# Patient Record
Sex: Male | Born: 1985 | Race: White | Hispanic: No | Marital: Married | Smoking: Former smoker
Health system: Southern US, Community
[De-identification: ages and names within clinical notes are randomized; demographics above are authoritative.]

## PROBLEM LIST (undated history)

## (undated) DIAGNOSIS — R7303 Prediabetes: Secondary | ICD-10-CM

## (undated) DIAGNOSIS — I10 Essential (primary) hypertension: Secondary | ICD-10-CM

## (undated) DIAGNOSIS — F419 Anxiety disorder, unspecified: Secondary | ICD-10-CM

## (undated) DIAGNOSIS — E785 Hyperlipidemia, unspecified: Secondary | ICD-10-CM

## (undated) DIAGNOSIS — F909 Attention-deficit hyperactivity disorder, unspecified type: Secondary | ICD-10-CM

## (undated) DIAGNOSIS — T7840XA Allergy, unspecified, initial encounter: Secondary | ICD-10-CM

## (undated) DIAGNOSIS — E7800 Pure hypercholesterolemia, unspecified: Secondary | ICD-10-CM

## (undated) DIAGNOSIS — I319 Disease of pericardium, unspecified: Secondary | ICD-10-CM

## (undated) DIAGNOSIS — R000 Tachycardia, unspecified: Secondary | ICD-10-CM

## (undated) HISTORY — DX: Allergy, unspecified, initial encounter: T78.40XA

## (undated) HISTORY — DX: Hyperlipidemia, unspecified: E78.5

## (undated) HISTORY — PX: TONSILLECTOMY: SUR1361

## (undated) HISTORY — DX: Prediabetes: R73.03

## (undated) HISTORY — DX: Anxiety disorder, unspecified: F41.9

## (undated) HISTORY — PX: OTHER SURGICAL HISTORY: SHX169

## (undated) HISTORY — DX: Essential (primary) hypertension: I10

---
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
## 2006-03-20 ENCOUNTER — Inpatient Hospital Stay (HOSPITAL_COMMUNITY): Admission: EM | Admit: 2006-03-20 | Discharge: 2006-03-21 | Payer: Self-pay | Admitting: Emergency Medicine

## 2006-03-20 ENCOUNTER — Ambulatory Visit: Payer: Self-pay | Admitting: Cardiology

## 2006-03-21 ENCOUNTER — Encounter: Payer: Self-pay | Admitting: Cardiology

## 2006-03-29 ENCOUNTER — Ambulatory Visit: Payer: Self-pay | Admitting: Cardiology

## 2006-03-29 ENCOUNTER — Ambulatory Visit: Payer: Self-pay

## 2006-03-29 ENCOUNTER — Encounter: Payer: Self-pay | Admitting: Cardiology

## 2006-04-05 ENCOUNTER — Ambulatory Visit: Payer: Self-pay | Admitting: Cardiology

## 2006-04-05 LAB — CONVERTED CEMR LAB
BUN: 12 mg/dL (ref 6–23)
CO2: 31 meq/L (ref 19–32)
Chloride: 102 meq/L (ref 96–112)
GFR calc non Af Amer: 101 mL/min
Sodium: 140 meq/L (ref 135–145)

## 2006-04-14 ENCOUNTER — Ambulatory Visit: Payer: Self-pay | Admitting: Cardiology

## 2006-05-03 ENCOUNTER — Ambulatory Visit: Payer: Self-pay | Admitting: Cardiology

## 2007-05-05 ENCOUNTER — Ambulatory Visit (HOSPITAL_COMMUNITY): Admission: RE | Admit: 2007-05-05 | Discharge: 2007-05-05 | Payer: Self-pay | Admitting: Family Medicine

## 2007-05-07 ENCOUNTER — Ambulatory Visit (HOSPITAL_COMMUNITY): Admission: RE | Admit: 2007-05-07 | Discharge: 2007-05-07 | Payer: Self-pay | Admitting: Family Medicine

## 2007-05-19 ENCOUNTER — Encounter (INDEPENDENT_AMBULATORY_CARE_PROVIDER_SITE_OTHER): Payer: Self-pay | Admitting: Family Medicine

## 2007-05-19 ENCOUNTER — Ambulatory Visit (HOSPITAL_COMMUNITY): Admission: RE | Admit: 2007-05-19 | Discharge: 2007-05-19 | Payer: Self-pay | Admitting: Family Medicine

## 2007-05-19 ENCOUNTER — Ambulatory Visit: Payer: Self-pay | Admitting: Vascular Surgery

## 2007-11-30 ENCOUNTER — Emergency Department (HOSPITAL_COMMUNITY): Admission: EM | Admit: 2007-11-30 | Discharge: 2007-12-01 | Payer: Self-pay | Admitting: Emergency Medicine

## 2008-08-09 ENCOUNTER — Encounter (INDEPENDENT_AMBULATORY_CARE_PROVIDER_SITE_OTHER): Payer: Self-pay | Admitting: *Deleted

## 2009-03-28 ENCOUNTER — Emergency Department (HOSPITAL_COMMUNITY): Admission: EM | Admit: 2009-03-28 | Discharge: 2009-03-28 | Payer: Self-pay | Admitting: Family Medicine

## 2009-04-26 ENCOUNTER — Emergency Department (HOSPITAL_COMMUNITY): Admission: EM | Admit: 2009-04-26 | Discharge: 2009-04-26 | Payer: Self-pay | Admitting: Family Medicine

## 2009-05-03 ENCOUNTER — Emergency Department (HOSPITAL_COMMUNITY): Admission: EM | Admit: 2009-05-03 | Discharge: 2009-05-03 | Payer: Self-pay | Admitting: Emergency Medicine

## 2010-06-26 NOTE — Assessment & Plan Note (Signed)
[facility] HEALTHCARE                            CARDIOLOGY OFFICE NOTE   Glenn Burton, Glenn Burton                     MRN:          [id number]  DATE:04/05/2006                            DOB:          06-Jul-1985    Glenn Burton is a 25 year old male that was recently admitted to [facility] on March 20, 2006 secondary to chest pain.  His pain  was in the substernal and left chest area, and increased with lying flat  and deep inspiration.  It improved with sitting up.  He was diagnosed  with pericarditis, and placed on Motrin.  Note, he did have a fever when  he was in the hospital.  There was no preceding URI.  His TSH was  normal.  His C reactive protein was 0.4.  A drug screen was negative.  He also had an echocardiogram at that time that showed normal LV  function.  A chest x-ray showed no acute abnormalities.  He returned in  followup on March 29, 2006.  Note, at that time he was placed back on  Motrin, as a pharmacist told him that aspirin was better, and he had not  taken Motrin.  He also had an HIV checked that was normal.  A PTD was  placed.  He did not return to have this checked, but he states that  there was no reaction.  A followup echocardiogram on February 19 showed  normal LV function, and there was no effusion.  He continues to have  chest pain.  He states his pain does worsen with lying flat and with  deep inspiration, and improves with sitting up.  It has been persistent  for several weeks.   His medications include Motrin 600 mg p.o. t.i.d. with food.   PHYSICAL EXAMINATION:  Shows blood pressure of 155/91.  His pulse is  100.  NECK:  Supple.  CHEST:  Clear.  CARDIOVASCULAR:  Regular rate and rhythm.  I could not appreciate a rub.  EXTREMITIES:  Showed no edema.   DIAGNOSES:  1. Pericarditis.  2. Mild hypertension in the setting of his pericarditis.   PLAN:  Mr. Durnil continues to have chest pain.  I have asked him  to  discontinue his Motrin, and we will begin Indocin 50 mg p.o. b.i.d. for  symptomatic relief.  I have asked him to take this with food.  We will  check a BMET today to follow his potassium and renal function.  We will  also observe for any type of GI distress related to his nonsteroidal  use.  I will also add colchicine 0.6 mg p.o. b.i.d. for 3 days, and then  decrease to 0.6 mg p.o. daily.  I will have him return in approximately  4 weeks.  If his pain persists, then we may need to try a course of  steroids.  His workup to date has been negative.  I think this is most  likely a viral pericarditis, as he did have a fever when he was in the  hospital.     Madolyn Frieze. Jens Som, MD, [facility]  Electronically Signed    BSC/MedQ  DD: 04/05/2006  DT: 04/05/2006  Job #: [id number]

## 2010-06-26 NOTE — H&P (Signed)
NAMEVERBON, GIANGREGORIO NO.:  [id number]   MEDICAL RECORD NO.:  [id number]          PATIENT TYPE:  INP   LOCATION:  1824                         FACILITY:  [facility]   PHYSICIAN:  Lorain Childes, MD DATE OF BIRTH:  1985/03/08   DATE OF ADMISSION:  03/20/2006  DATE OF DISCHARGE:                              HISTORY & PHYSICAL   PRIMARY CARE PHYSICIAN:  Dr. Micah Noel.   CHIEF COMPLAINT:  Chest pain.   HISTORY OF PRESENT ILLNESS:  The patient is a 25 year old gentleman who  works here who began having chest pain this evening or this early  morning around 2:30 a.m.  He is brought to the emergency room for  further evaluation and management.  He reports his pain was sharp and  stabbing, severe, and associated with intense nausea and some shortness  of breath and also lightheadedness.  This lasted approximately 30  seconds and then resolved, but then he continued to have some nausea and  a dull ache in his chest.  He reports that the pain was occurring while  he was walking around and improves when he is recumbent.  He also  develops a pain when he sits up on the stretcher and leaning forward.  He says he feels best without any discomfort when he is lying back.  He  still has minimal dull pain while he is sitting up.  He denies any  heaviness or tightness to his pain.  He has not had pain like this  previously, but has had some chest pain for which he was seen by his  primary care physician and told that he was okay.  He denies a recent  viral illness.  No nausea or vomiting.  No diarrhea.  No coughing or  sputum production.  He also states that he grew up playing sports, but  he did have some difficulty with shortness of breath and keeping up with  the other kids.  He denies any drug use or any herbal medication use.   PAST MEDICAL HISTORY:  Unremarkable.   MEDICATIONS:  None.   He has no known drug allergies.   SOCIAL HISTORY:  He lives in [place].  He denies  any tobacco,  alcohol, or drugs.  He follows a regular diet.   FAMILY HISTORY:  His mother is alive at the age of 68, with no health  problems.  Father is alive in his 69s with diabetes and hypertension and  anxiety disorder.  He has a great-grandfather who had CAD late in life.   REVIEW OF SYSTEMS:  He denies any fevers or chills.  No headache or  visual change.  No skin rashes or lesions.  He reports chest pain as  stated in the HPI and shortness of breath.  He denies dyspnea on  exertion, orthopnea, PND, or lower extremity edema.  No palpitations.  No syncope.  No coughing or wheezing.  No urinary problems.  No GI  symptoms.  No bright red blood per rectum, no melena, no hematochezia.  No arthralgias or myalgias.  All other systems are negative.   PHYSICAL EXAMINATION:  VITAL SIGNS:  Temperature 97.9, pulse 120,  respirations 18, blood pressure 143/98.  GENERAL:  He is a well-developed, well-nourished young man lying in bed  in no acute distress.  HEENT:  Normocephalic, atraumatic.  NECK:  JVP is approximately 6 cm.  He has 2+ carotid upstrokes.  There  are no bruits.  LUNGS:  Clear to auscultation throughout, with no wheezing or rales  present.  HEART:  Tachycardic.  Regular rhythm.  Normal S1, split S2.  No murmurs.  His PMI is nondisplaced.  ABDOMEN:  Soft.  Positive bowel sounds.  Nontender.  Normoactive bowel  sounds.  No organomegaly.  EXTREMITIES:  He has no edema.  He has 2+ distal pulses.  NEUROLOGIC:  He is alert and oriented.  His cranial nerves are grossly  intact.  His strength is appropriate.  CHEST WALL:  On palpation of his chest wall, he has an area of  tenderness to deep palpation in the left midclavicular region.   Chest x-ray is pending.  EKG shows sinus rhythm, rate of 107, normal  intervals.  He has diffuse ST elevation of approximately 1 mm, with some  PR depression in the inferior and precordial leads.   LABORATORY DATA:  His hematocrit is 60.  His  creatinine is 1, potassium  is 4.4.  His glucose is 122.  His D-dimer is less than 0.22.  His CK-MB  is less than 1, troponin less than 0.05, myoglobin 66.7.   ASSESSMENT AND PLAN:  The patient is a 25 year old gentleman with no  prior history, now with chest pain.  1. Chest pain.  Description is more consistent with pericarditis.  He      denies any recent viral illness or herbal medication use.  I did      perform a quick look echocardiogram, and there are no apparent wall      motion abnormalities, but we do have limited views with this.  Will      admit him on telemetry, cycle his cardiac enzymes, and obtain a      full echocardiogram this morning.  Will likely need further imaging      with cardiac CT versus MRI to assess the origin of his coronaries      and to look at his pericardium thoroughly.  Will treat him with      aspirin for now and check a TSH and a C-reactive protein.  Will      continue to monitor him closely.  2. Hypertension.  His blood pressure is elevated.  Also, he is      tachycardic.  I am checking a thyroid panel to rule out underlying      thyroid disease contributing to his symptoms.  Will monitor his      blood pressure closely.  3. Tachycardia.  He is in sinus rhythm, with sinus tachycardia.  Will      continue to evaluate him.      Lorain Childes, MD  Electronically Signed     CGF/MEDQ  D:  03/20/2006  T:  03/21/2006  Job:  [id number]

## 2010-06-26 NOTE — Discharge Summary (Signed)
NAMEDONALD, MEMOLI NO.:  [id number]   MEDICAL RECORD NO.:  [id number]          PATIENT TYPE:  INP   LOCATION:  2027                         FACILITY:  [facility]   PHYSICIAN:  Gerrit Friends. Dietrich Pates, MD, FACCDATE OF BIRTH:  Sep 09, 1985   DATE OF ADMISSION:  03/20/2006  DATE OF DISCHARGE:  03/21/2006                               DISCHARGE SUMMARY   PRIMARY CARE PHYSICIAN:  Stacie Acres. White, M.D.   DISCHARGE DIAGNOSES:  1. Atypical chest pain.  The patient had negative cardiac workup.      Etiology unclear.  Musculoskeletal pain versus      pleural/pericarditis.  2. Nausea. Being treated with Phenergan, nausea most likely secondary      to Toradol and Motrin.   PAST MEDICAL HISTORY:  Unremarkable.   MEDICATIONS:  None.   ALLERGIES:  NONE.   HOSPITAL COURSE:  Mr. 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  He described it as a sharp  stabbing severe pain associated with nausea and some shortness of  breath, also with lightheadedness.  He stated the pain occurred while he  was walking around and improved when he was recumbent.  He also stated  the pain was worse when he sat up and leaned forward and seemed to be  relieve best when he was lying back.  The patient has been afebrile,  slightly tachycardic with pain.  Blood pressure mildly elevated  initially at 143/98.  Normal heart sounds.   Initial lab work:  All within normal limits.  The patient was admitted  for observation.  He underwent a 2D echocardiogram on the 11th that  showed normal left ventricular regional wall motion, normal left  ventricular systolic function.  No pericardial effusion.  Dr. Dietrich Pates  in to see patient.  Patient noted to have a fever on the evening of  admission, however, afebrile on the day of discharge.  Temperature  peaked at 102.2 about 9:30 p.m. on February 10th.   Since that time,  temperature was within normal limits and blood pressure 128/76 at the  time of discharge. The patient saturated 99% on room air.  The patient  being discharged home with instructions to follow up with Dr. Laurann Montana within the next week.  I have written him a note.  He can return  to work after cleared by Dr. Cliffton Asters, also prescription for Phenergan 25  mg.  The patient instructed to take 1/2 to 1 tablet p.o. q.6h. p.r.n. if  needed, 30 tablets with no refills.  He can also continue to use the  Motrin 600 mg every 6 hours for the next 3 to 4  days if tolerated and follow up with Dr. Cliffton Asters.  He is instructed to  return to the emergency room if he experiences any further increase in  discomfort or increase in fevers or shortness of breath.   Duration of discharge encounter 25 minutes.      Dorian Pod, ACNP      Gerrit Friends.  Dietrich Pates, MD, [place]  Electronically Signed    MB/MEDQ  D:  03/21/2006  T:  03/22/2006  Job:  [id number]   cc:   Stacie Acres. Cliffton Asters, M.D.

## 2010-06-26 NOTE — Assessment & Plan Note (Signed)
[facility] HEALTHCARE                            CARDIOLOGY OFFICE NOTE   Glenn Burton, Glenn Burton                     MRN:          [id number]  DATE:03/29/2006                            DOB:          05-29-1985    REFERRING PHYSICIAN:  Stacie Acres. Cliffton Asters, M.D.   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  He also had ST elevation on his EKG, and was felt to have  probable pericarditis.  A 2D echo did not show a pleural effusion, and  was normal.  He was asked to take Motrin at home.  When the patient went  to the pharmacy to get the Motrin, they told him aspirin was better, and  told him to take 200 mg 3 times a day, so that is what he has been  taking.  Since he has been home, he continues to have a significant  amount of chest pain, mostly when he lies down or lies on his left side.  He says it feels like a pressure across his chest with a sharp shooting  pain in the left upper quadrant and chest area when he first lies down  that goes away quickly.  He does not have pain with deep inspiration or  sitting up.  He does get short of breath when he is walking stairs or  walking much of a distance, and he is aware that the pain is always  there, but not as severe when he lies down.  He has not had any fever  since he left the hospital.  He denies drug use or abuse.  He denies  smoking.  He has been stuck in the hospital before and exposed to TB  patients, but has had his vaccines.   CURRENT MEDICATIONS:  Aspirin 200 mg 3 times a day.   PHYSICAL EXAMINATION:  This is a pleasant 25 year old white male, in no  acute distress.  Blood pressure 144/82.  Pulse 75.  Weight 187.  NECK:  Without JVD, HDR, bruit or thyroid enlargement.  LUNGS:  Decreased breath sounds, but they are clear.  HEART:  Regular rate and rhythm at 70 beats per minute.  Normal S1 and  S2 without murmur, rub, bruit, thrill or heave noted.  ABDOMEN:  Soft without organomegaly, masses, lesions or abnormal  tenderness.  EXTREMITIES:  Without cyanosis, clubbing or edema.  Good distal pulses.  EKG:  Normal sinus rhythm with ST elevation inferolaterally.  These  inferior ST elevations are more prominent than in the hospital.   IMPRESSION:  1. Pericarditis.  Rule out myocarditis component.  2. Slight hypertension in the hospital.   PLAN:  Dr. Jens Som recommends a 2D echo today to make sure there is no  wall motion abnormality, given his EKG changes.  We will also do a PPD  and HIV test today.  We have  given him a prescription for Motrin 600 mg t.i.d.  Pending the results  of the echo, he may require hospitalization.  If not, he will see Dr.  Jens Som back in followup next week.      Jacolyn Reedy, PA-C  Electronically Signed      Madolyn Frieze. Jens Som, MD, [facility]  Electronically Signed   ML/MedQ  DD: 03/29/2006  DT: 03/29/2006  Job #: [id number]

## 2010-06-26 NOTE — Assessment & Plan Note (Signed)
[facility] HEALTHCARE                            CARDIOLOGY OFFICE NOTE   Glenn Burton, Glenn Burton                     MRN:          [id number]  DATE:04/14/2006                            DOB:          31-May-1985    HISTORY OF PRESENT ILLNESS:  Glenn Burton is a 25 year old patient  follow by Dr. Jens Som recently with diagnosis of pericarditis.  He saw  Dr. Jens Som on February 26 and was started on Indocin as well as  Colchicine.  He was due to go back to work today, but scheduled this  appointment as he was continuing to feel poorly.  Actually in the office  today, he admits that his symptoms overall have improved since he saw  Dr. Jens Som last on February 26.  He was having difficulty with laying  flat as well as deep breathing.  He currently denies any further chest  discomfort with laying flat or deep breathing.  His discomfort is still  central and feels like a pressure sensation.  He denies any syncope.  He  does note that if he over exerts himself, he does feel more intense  pain.  He denies any further fevers or chills.  He has had a mild  nonproductive cough.  There has been no hemoptysis.  He is tolerating  the Indocin well.   CURRENT MEDICATIONS:  1. Indocin 50 mg twice daily.  2. Colchicine 0.6 mg daily.   ALLERGIES:  AMOXICILLIN.   PHYSICAL EXAMINATION:  He is a well-nourished, well-developed male in no  acute distress.  Blood pressure is 130/82, pulse 80, weight 186 pounds.  HEENT:  Unremarkable.  NECK:  Supple without jugular venous distension.  CARDIAC:  Normal S1, S2, regular rate and rhythm, no rubs.  LUNGS:  Clear to auscultation bilaterally.  ABDOMEN:  Soft, nontender.  EXTREMITIES:  No edema.  There is no pulses paradoxus noted.   DATABASE:  Recent echocardiogram March 29, 2006 revealed no wiggle of  the septum to suggest constriction, ejection fraction 60%, no wall  motion abnormalities.   IMPRESSION:  1. Pericarditis.  2. Borderline elevated blood pressure in the setting of pericarditis.   PLAN:  The patient presents to the office today for earlier follow up  regarding his pericarditis.  This has been felt to be of a viral nature.  He questions whether or not he could be placed on antibiotics.  I  explained to him why this would not be warranted at this time.  Overall,  he does seem to be improving.  I think we should continue on with the  same therapy at this point in time.  I have explained to him that it  might take quite some time for him to feel back to normal.  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  I  have given him a note to keep him out of work until he sees Dr. Jens Som  back.  I have asked him to continue to slowly increase his activity as  tolerated.  He knows that if his symptoms  should worsen or show no  improvement over the next couple of weeks, we should see him back  sooner.  At that point in time, we would switch him from nonsteroidal's  to a steroid dose pack.  Otherwise he can follow up with Dr. Jens Som as  scheduled.   ADDENDUM  The patient did have a electrocardiogram today.  This revealed sinus  rhythm and a heart rate of 83.  He does have Q waves inferiorly and  nonspecific STT wave changes.  He did have some slight ST depression in  the inferior leads.  This is much improved since his previous tracings.      Tereso Newcomer, PA-C  Electronically Signed      Jesse Sans. Daleen Squibb, MD, [facility]  Electronically Signed   SW/MedQ  DD: 04/14/2006  DT: 04/14/2006  Job #: [id number]

## 2010-06-26 NOTE — Assessment & Plan Note (Signed)
[facility] HEALTHCARE                            CARDIOLOGY OFFICE NOTE   YVAN, DORITY                     MRN:          [id number]  DATE:05/03/2006                            DOB:          1985/06/28    Mr. Loiseau is a pleasant gentleman who has a history of recent  pericarditis.  He was treated with Indocin and colchicine.  His symptoms  have now resolved, and he is off all the medications.  His workup  previously has been negative.   His medications now include only Indocin as needed.   PHYSICAL EXAMINATION:  Shows a blood pressure of 130/70.  His pulse is  80.  CHEST:  Clear.  CARDIOVASCULAR:  Reveals a regular rate and rhythm.  There are rubs  noted.  EXTREMITIES:  Show no edema.   DIAGNOSES:  Recent pericarditis, resolved.   PLAN:  Mr. Higinbotham is doing much better, and he is off his medications  now with no chest pain.  We will, therefore, make no changes.  I will  see him back in approximately 6 months.  If he is asymptomatic then, we  will plan to see him on an as-needed basis.     Madolyn Frieze Jens Som, MD, [facility]  Electronically Signed    BSC/MedQ  DD: 05/03/2006  DT: 05/03/2006  Job #: [id number]

## 2011-01-19 ENCOUNTER — Emergency Department (HOSPITAL_COMMUNITY)
Admission: EM | Admit: 2011-01-19 | Discharge: 2011-01-19 | Disposition: A | Discharge status: Home / Self Care | Payer: 59 | Source: Home / Self Care | Attending: Emergency Medicine | Admitting: Emergency Medicine

## 2011-01-19 ENCOUNTER — Encounter: Payer: Self-pay | Admitting: *Deleted

## 2011-01-19 DIAGNOSIS — D179 Benign lipomatous neoplasm, unspecified: Secondary | ICD-10-CM

## 2011-01-19 DIAGNOSIS — G43909 Migraine, unspecified, not intractable, without status migrainosus: Secondary | ICD-10-CM

## 2011-01-19 HISTORY — DX: Disease of pericardium, unspecified: I31.9

## 2011-01-19 HISTORY — DX: Migraine, unspecified, not intractable, without status migrainosus: G43.909

## 2011-01-19 MED ORDER — KETOROLAC TROMETHAMINE 30 MG/ML IJ SOLN
INTRAMUSCULAR | Status: AC
  Filled 2011-01-19: qty 1

## 2011-01-19 MED ORDER — KETOROLAC TROMETHAMINE 30 MG/ML IJ SOLN
30.0000 mg | Freq: Once | INTRAMUSCULAR | Status: AC
  Administered 2011-01-19: 30 mg via INTRAMUSCULAR

## 2011-01-19 MED ORDER — KETOROLAC TROMETHAMINE 10 MG PO TABS: 10.0000 mg | ORAL_TABLET | Freq: Four times a day (QID) | ORAL | Status: AC | PRN

## 2011-01-19 NOTE — ED Notes (Signed)
Hal  Reports   Symptoms  Of  Headache  To  Include    Photophobia   And  Frontal  Pain        Which  She  Reports  Started  Around  2  Pm  Today        He  Reports  Family  History of  Migraines  In past  -  He  denys  Any    specefic  Recent  Injury

## 2011-01-19 NOTE — ED Provider Notes (Signed)
History     CSN: [id number] Arrival date & time: 01/19/2011  5:27 PM   First MD Initiated Contact with Patient 01/19/11 1700      Chief Complaint  Patient presents with  . Headache    (Consider location/radiation/quality/duration/timing/severity/associated sxs/prior treatment) HPI Comments: Started today around 2 pm, light bothers me, frontal HA, get these HA like every couple of months, have family with migraines, also have this lump on my left upper back"  Patient is a 25 y.o. male presenting with headaches. The history is provided by the patient.  Headache The primary symptoms include headaches and nausea. Primary symptoms do not include syncope, loss of consciousness, altered mental status, seizures, dizziness, visual change, paresthesias, focal weakness, fever or vomiting. The symptoms began 3 to 5 days ago. The symptoms are unchanged.  The headache is associated with photophobia. The headache is not associated with eye pain, visual change, neck stiffness, paresthesias or weakness.  Additional symptoms include pain and photophobia. Additional symptoms do not include neck stiffness, weakness, taste disturbance or hearing loss. Medical issues do not include seizures. Associated medical issues comments: migraines Ha's.    Past Medical History  Diagnosis Date  . Pericarditis     History reviewed. No pertinent past surgical history.  Family History  Problem Relation Age of Onset  . Migraines Mother   . Diabetes Father   . Cancer Father   . Hypertension Father   . Coronary artery disease Father     History  Substance Use Topics  . Smoking status: Never Smoker   . Smokeless tobacco: Not on file  . Alcohol Use: Yes     Occasonal      Review of Systems  Constitutional: Negative for fever.  HENT: Negative for hearing loss and neck stiffness.   Eyes: Positive for photophobia. Negative for pain.  Respiratory: Negative for shortness of breath.   Cardiovascular: Negative  for syncope.  Gastrointestinal: Positive for nausea. Negative for vomiting.  Neurological: Positive for headaches. Negative for dizziness, focal weakness, seizures, loss of consciousness, weakness and paresthesias.  Psychiatric/Behavioral: Negative for altered mental status.    Allergies  Amoxicillin  Home Medications   Current Outpatient Rx  Name Route Sig Dispense Refill  . KETOROLAC TROMETHAMINE 10 MG PO TABS Oral Take 1 tablet (10 mg total) by mouth every 6 (six) hours as needed for pain. 20 tablet 0    BP 132/82  Pulse 82  Temp(Src) 99.2 F (37.3 C) (Oral)  SpO2 100%  Physical Exam  Nursing note and vitals reviewed. Constitutional: He is oriented to person, place, and time. He appears well-developed and well-nourished.  HENT:  Head: Normocephalic.  Eyes: Conjunctivae and EOM are normal. Pupils are equal, round, and reactive to light.  Neck: Normal range of motion. No JVD present.  Cardiovascular: Regular rhythm.   No extrasystoles are present.  Pulmonary/Chest: Effort normal and breath sounds normal.  Abdominal: Soft.  Musculoskeletal:       Arms: Lymphadenopathy:    He has no cervical adenopathy.  Neurological: He is alert and oriented to person, place, and time. He has normal strength. No cranial nerve deficit or sensory deficit. Coordination normal.  Skin: Skin is warm.    ED Course  Procedures (including critical care time)  Labs Reviewed - No data to display No results found.   1. Migraine headache   2. Lipoma       MDM  To discontinue Motrin or other NSAIDS-  Jimmie Molly, MD 01/19/11 2059

## 2011-04-26 ENCOUNTER — Encounter (INDEPENDENT_AMBULATORY_CARE_PROVIDER_SITE_OTHER): Payer: Self-pay | Admitting: Surgery

## 2011-05-14 ENCOUNTER — Ambulatory Visit (INDEPENDENT_AMBULATORY_CARE_PROVIDER_SITE_OTHER): Payer: Commercial Managed Care - PPO | Admitting: Surgery

## 2011-09-29 ENCOUNTER — Emergency Department (HOSPITAL_BASED_OUTPATIENT_CLINIC_OR_DEPARTMENT_OTHER)
Admission: EM | Admit: 2011-09-29 | Discharge: 2011-09-29 | Disposition: A | Discharge status: Home / Self Care | Payer: Self-pay | Attending: Emergency Medicine | Admitting: Emergency Medicine

## 2011-09-29 ENCOUNTER — Encounter (HOSPITAL_BASED_OUTPATIENT_CLINIC_OR_DEPARTMENT_OTHER): Payer: Self-pay | Admitting: *Deleted

## 2011-09-29 DIAGNOSIS — Z880 Allergy status to penicillin: Secondary | ICD-10-CM | POA: Insufficient documentation

## 2011-09-29 DIAGNOSIS — M546 Pain in thoracic spine: Secondary | ICD-10-CM | POA: Insufficient documentation

## 2011-09-29 DIAGNOSIS — M545 Low back pain, unspecified: Secondary | ICD-10-CM | POA: Insufficient documentation

## 2011-09-29 DIAGNOSIS — J45909 Unspecified asthma, uncomplicated: Secondary | ICD-10-CM | POA: Insufficient documentation

## 2011-09-29 DIAGNOSIS — S39012A Strain of muscle, fascia and tendon of lower back, initial encounter: Secondary | ICD-10-CM

## 2011-09-29 MED ORDER — CYCLOBENZAPRINE HCL 10 MG PO TABS: 10.0000 mg | ORAL_TABLET | Freq: Three times a day (TID) | ORAL | Status: AC | PRN

## 2011-09-29 NOTE — ED Provider Notes (Signed)
History     CSN: [id number]  Arrival date & time 09/29/11  1742   First MD Initiated Contact with Patient 09/29/11 1839      Chief Complaint  Patient presents with  . Neck Pain  . Back Pain    (Consider location/radiation/quality/duration/timing/severity/associated sxs/prior treatment) HPI Comments: Patient is an emt.  Has history of some low back discomfort, however this has worsened significantly since catching a patient who was in the process of passing out.  There is no radiation in to the legs and no bowel or bladder complaints.    Patient is a 26 y.o. male presenting with neck pain and back pain. The history is provided by the patient.  Neck Pain  This is a new problem. Episode onset: 3 days ago. The problem occurs constantly. The problem has been gradually worsening. The pain is associated with lifting a heavy object. There has been no fever. The pain does not radiate. The pain is moderate. The symptoms are aggravated by twisting, bending and position. The pain is the same all the time.  Back Pain     Past Medical History  Diagnosis Date  . Pericarditis   . Asthma   . Migraine   . Lipoma     Past Surgical History  Procedure Date  . Tonsillectomy age 71    tubes in ears  . Tubes     Family History  Problem Relation Age of Onset  . Migraines Mother   . Diabetes Father   . Cancer Father   . Hypertension Father   . Coronary artery disease Father     History  Substance Use Topics  . Smoking status: Former Smoker    Quit date: 04/26/2007  . Smokeless tobacco: Never Used  . Alcohol Use: Yes     Occasonal      Review of Systems  HENT: Positive for neck pain.   Musculoskeletal: Positive for back pain.  All other systems reviewed and are negative.    Allergies  Amoxicillin  Home Medications   Current Outpatient Rx  Name Route Sig Dispense Refill  . IBUPROFEN 200 MG PO TABS Oral Take 800 mg by mouth every 6 (six) hours as needed. For pain.    Marland Kitchen  KETOROLAC TROMETHAMINE 10 MG PO TABS Oral Take 10 mg by mouth every 6 (six) hours as needed. For pain.      BP 139/88  Pulse 88  Temp 98.3 F (36.8 C) (Oral)  Resp 18  Ht 6\' 1"  (1.854 m)  Wt 225 lb (102.059 kg)  BMI 29.69 kg/m2  SpO2 98%  Physical Exam  Nursing note and vitals reviewed. Constitutional: He is oriented to person, place, and time. He appears well-developed and well-nourished. No distress.  HENT:  Head: Normocephalic and atraumatic.  Neck: Normal range of motion. Neck supple.  Musculoskeletal: Normal range of motion.       There is ttp in the lower thoracic/upper lumbar soft tissues.    Neurological: He is alert and oriented to person, place, and time.       The dtr's are 2+ and equal in the ble.  Strength is 5/5 in the ble.  Ambulates on heels and toes without difficulty.  Skin: Skin is warm and dry. He is not diaphoretic.    ED Course  Procedures (including critical care time)  Labs Reviewed - No data to display No results found.   No diagnosis found.    MDM  There is no evidence of cauda  equina or other emergent cause.  He will be discharged with nsaids, flexeril and follow up if not improving in the next week.  He will also be excused from work for the next three days.        Geoffery Lyons, MD 09/29/11 [phone]

## 2011-09-29 NOTE — ED Notes (Signed)
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

## 2012-10-11 ENCOUNTER — Emergency Department (HOSPITAL_COMMUNITY)
Admission: EM | Admit: 2012-10-11 | Discharge: 2012-10-11 | Disposition: A | Discharge status: Home / Self Care | Payer: 59 | Attending: Emergency Medicine | Admitting: Emergency Medicine

## 2012-10-11 ENCOUNTER — Encounter (HOSPITAL_COMMUNITY): Payer: Self-pay | Admitting: Emergency Medicine

## 2012-10-11 DIAGNOSIS — M549 Dorsalgia, unspecified: Secondary | ICD-10-CM | POA: Insufficient documentation

## 2012-10-11 DIAGNOSIS — Z880 Allergy status to penicillin: Secondary | ICD-10-CM | POA: Insufficient documentation

## 2012-10-11 DIAGNOSIS — Z862 Personal history of diseases of the blood and blood-forming organs and certain disorders involving the immune mechanism: Secondary | ICD-10-CM | POA: Insufficient documentation

## 2012-10-11 DIAGNOSIS — Z87891 Personal history of nicotine dependence: Secondary | ICD-10-CM | POA: Insufficient documentation

## 2012-10-11 DIAGNOSIS — Z8679 Personal history of other diseases of the circulatory system: Secondary | ICD-10-CM | POA: Insufficient documentation

## 2012-10-11 DIAGNOSIS — Z8639 Personal history of other endocrine, nutritional and metabolic disease: Secondary | ICD-10-CM | POA: Insufficient documentation

## 2012-10-11 DIAGNOSIS — J45909 Unspecified asthma, uncomplicated: Secondary | ICD-10-CM | POA: Insufficient documentation

## 2012-10-11 DIAGNOSIS — L0591 Pilonidal cyst without abscess: Secondary | ICD-10-CM | POA: Insufficient documentation

## 2012-10-11 MED ORDER — IBUPROFEN 800 MG PO TABS: 800.0000 mg | ORAL_TABLET | Freq: Three times a day (TID) | ORAL | Status: DC | PRN

## 2012-10-11 MED ORDER — HYDROCODONE-ACETAMINOPHEN 5-325 MG PO TABS: 1.0000 | ORAL_TABLET | ORAL | Status: DC | PRN

## 2012-10-11 NOTE — ED Provider Notes (Signed)
CSN: [id number]     Arrival date & time 10/11/12  1702 History  This chart was scribed for Trixie Dredge, [place], working with Toy Baker, MD by Coppernoll Kelch, ED Scribe. This patient was seen in room WTR7/WTR7 and the patient's care was started at 7:02 PM.    Chief Complaint  Patient presents with  . Tailbone Pain    The history is provided by the patient. No language interpreter was used.    HPI Comments: Glenn Burton is a 27 y.o. male who presents to the Emergency Department complaining of lower tailbone pain and tearing with associated bleeding and clear liquid from the area. The patient has a history of these episodes that have been going on for years but the most recent occurred 2 days ago. Constant bleeding began a day ago and states that it will not stop oozing blood, is soaking through his clothes. Patient reports worsening pain, tearing and bleeding to the area after walking, sitting on hard surfaces or touching. Patient reports the episodes occur every couple of months and bleeds for a few hours but then goes away. He reports this is the worst episode yet and the bleeding has not stopped. Patient reports a hot shower and cleaning the area usually relieves the episode but has not worked this time. He has seen his PCP for it a few years ago but wasn't given a definitive diagnosis. Patient denies fever, chills, pus at the area, or bowel changes. He denies currently taking anticoagulants or chronic NSAIDS.    Past Medical History  Diagnosis Date  . Pericarditis   . Asthma   . Migraine   . Lipoma    Past Surgical History  Procedure Laterality Date  . Tonsillectomy  age 79    tubes in ears  . Tubes     Family History  Problem Relation Age of Onset  . Migraines Mother   . Diabetes Father   . Cancer Father   . Hypertension Father   . Coronary artery disease Father    History  Substance Use Topics  . Smoking status: Former Smoker    Quit date: 04/26/2007  . Smokeless  tobacco: Never Used  . Alcohol Use: Yes     Comment: Occasonal    Review of Systems  Constitutional: Negative for fever and chills.  Gastrointestinal: Negative for abdominal pain, diarrhea, constipation and blood in stool.       Negative for bowel incontinence.  Musculoskeletal: Positive for back pain.  Skin: Positive for wound. Negative for color change.    Allergies  Amoxicillin and Clindamycin/lincomycin  Home Medications   Current Outpatient Rx  Name  Route  Sig  Dispense  Refill  . albuterol (PROVENTIL HFA;VENTOLIN HFA) 108 (90 BASE) MCG/ACT inhaler   Inhalation   Inhale 2 puffs into the lungs every 6 (six) hours as needed for wheezing.         Marland Kitchen ibuprofen (ADVIL,MOTRIN) 200 MG tablet   Oral   Take 800 mg by mouth every 6 (six) hours as needed. For pain.          Triage Vitals: BP 145/99  Pulse 100  Temp(Src) 98.9 F (37.2 C) (Oral)  Resp 20  SpO2 99%  Physical Exam  Nursing note and vitals reviewed. Constitutional: He appears well-developed and well-nourished. No distress.  HENT:  Head: Normocephalic and atraumatic.  Neck: Neck supple.  Pulmonary/Chest: Effort normal.  Neurological: He is alert.  Skin: He is not diaphoretic.  Gluteal cleft area without erythema, edema, warmth.      ED Course  Procedures (including critical care time)  DIAGNOSTIC STUDIES:  Oxygen Saturation is 99% on room air, normal by my interpretation.    COORDINATION OF CARE:  7:12 PM - Patient verbalizes understanding and agrees with treatment plan.    Labs Review Labs Reviewed - No data to display Imaging Review No results found.  MDM   1. Pilonidal cyst without abscess    Pt with gluteal cleft lesion as described above, likely a pilonidal cyst.  No e/o of abscess or current infection.  No active bleeding though mucoid surface of papule appears friable.  Pt referred to [place] surgery for further evaluation and treatment.  Discussed  findings,  treatment, and follow up  with patient.  Pt given return precautions.  Pt verbalizes understanding and agrees with plan.       I personally performed the services described in this documentation, which was scribed in my presence. The recorded information has been reviewed and is accurate.    Trixie Dredge, PA-C 10/11/12 1948

## 2012-10-11 NOTE — ED Notes (Signed)
Pt reports irritation and slight bleeding to the bottom of his tailbone that has been going since Monday. Pt denies N/V/D. Pt reports the area had similar symptoms before, however was never evaluated. Pt is A/O x4 and in NAD.

## 2012-10-13 NOTE — ED Provider Notes (Signed)
Medical screening examination/treatment/procedure(s) were performed by non-physician practitioner and as supervising physician I was immediately available for consultation/collaboration.  Lan Mcneill T Mervil Wacker, MD 10/13/12 0757 

## 2013-11-28 DIAGNOSIS — Z872 Personal history of diseases of the skin and subcutaneous tissue: Secondary | ICD-10-CM | POA: Diagnosis not present

## 2013-11-28 DIAGNOSIS — Z8679 Personal history of other diseases of the circulatory system: Secondary | ICD-10-CM | POA: Diagnosis not present

## 2013-11-28 DIAGNOSIS — S59811A Other specified injuries right forearm, initial encounter: Secondary | ICD-10-CM | POA: Insufficient documentation

## 2013-11-28 DIAGNOSIS — S59911A Unspecified injury of right forearm, initial encounter: Secondary | ICD-10-CM | POA: Diagnosis present

## 2013-11-28 DIAGNOSIS — Z880 Allergy status to penicillin: Secondary | ICD-10-CM | POA: Diagnosis not present

## 2013-11-28 DIAGNOSIS — J45909 Unspecified asthma, uncomplicated: Secondary | ICD-10-CM | POA: Insufficient documentation

## 2013-11-28 DIAGNOSIS — X58XXXA Exposure to other specified factors, initial encounter: Secondary | ICD-10-CM | POA: Diagnosis not present

## 2013-11-28 DIAGNOSIS — Z79899 Other long term (current) drug therapy: Secondary | ICD-10-CM | POA: Diagnosis not present

## 2013-11-28 DIAGNOSIS — Y929 Unspecified place or not applicable: Secondary | ICD-10-CM | POA: Insufficient documentation

## 2013-11-28 DIAGNOSIS — Y93F2 Activity, caregiving, lifting: Secondary | ICD-10-CM | POA: Diagnosis not present

## 2013-11-28 DIAGNOSIS — Z87891 Personal history of nicotine dependence: Secondary | ICD-10-CM | POA: Insufficient documentation

## 2013-11-28 NOTE — ED Notes (Signed)
Patient works for [employer] - was trying to get a patient in side when the stair chair, but that en the stair chair flattened onto his right arm and he had to hold the patient which caused trauma to his right arm. Patient has numbness and swelling to his right arm. Pain in elbow to the right hand

## 2013-11-29 ENCOUNTER — Encounter (HOSPITAL_BASED_OUTPATIENT_CLINIC_OR_DEPARTMENT_OTHER): Payer: Self-pay | Admitting: Emergency Medicine

## 2013-11-29 ENCOUNTER — Emergency Department (HOSPITAL_BASED_OUTPATIENT_CLINIC_OR_DEPARTMENT_OTHER): Payer: Worker's Compensation

## 2013-11-29 ENCOUNTER — Emergency Department (HOSPITAL_BASED_OUTPATIENT_CLINIC_OR_DEPARTMENT_OTHER)
Admission: EM | Admit: 2013-11-29 | Discharge: 2013-11-29 | Disposition: A | Discharge status: Home / Self Care | Payer: Worker's Compensation | Attending: Emergency Medicine | Admitting: Emergency Medicine

## 2013-11-29 DIAGNOSIS — S59811A Other specified injuries right forearm, initial encounter: Secondary | ICD-10-CM

## 2013-11-29 MED ORDER — HYDROCODONE-ACETAMINOPHEN 5-325 MG PO TABS
2.0000 | ORAL_TABLET | Freq: Once | ORAL | Status: AC
  Administered 2013-11-29: 2 via ORAL
  Filled 2013-11-29: qty 2

## 2013-11-29 MED ORDER — HYDROCODONE-ACETAMINOPHEN 5-325 MG PO TABS: 1.0000 | ORAL_TABLET | Freq: Four times a day (QID) | ORAL | Status: DC | PRN

## 2013-11-29 NOTE — ED Provider Notes (Signed)
CSN: [id number]     Arrival date & time 11/28/13  2353 History   First MD Initiated Contact with Patient 11/29/13 0038     Chief Complaint  Patient presents with  . 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 He was carrying a woman upstairs in a stair chair. The chair collapsed unexpectedly causing a sudden pulling force on his right upper rarely. He did not like to of the stair chair. He is complaining of pain in his right upper extremity from the shoulder down. It is most prominent in the right elbow and is also having pain and paresthesias of the right forearm and hand. He states his right hand is, in general, most prominently on the fourth and fifth fingers. Motor function is intact. Pain is moderate to severe, worse with palpation or movement.   Past Medical History  Diagnosis Date  . Pericarditis   . Asthma   . Migraine   . Lipoma    Past Surgical History  Procedure Laterality Date  . Tonsillectomy  age 24    tubes in ears  . Tubes     Family History  Problem Relation Age of Onset  . Migraines Mother   . Diabetes Father   . Cancer Father   . Hypertension Father   . Coronary artery disease Father    History  Substance Use Topics  . Smoking status: Former Smoker    Quit date: 04/26/2007  . Smokeless tobacco: Never Used  . Alcohol Use: Yes     Comment: Occasonal    Review of Systems  All other systems reviewed and are negative.   Allergies  Amoxicillin and Clindamycin/lincomycin  Home Medications   Prior to Admission medications   Medication Sig Start Date End Date Taking? Authorizing Provider  albuterol (PROVENTIL HFA;VENTOLIN HFA) 108 (90 BASE) MCG/ACT inhaler Inhale 2 puffs into the lungs every 6 (six) hours as needed for wheezing.    Historical Provider, MD  HYDROcodone-acetaminophen (NORCO/VICODIN) 5-325 MG per tablet Take  1 tablet by mouth every 4 (four) hours as needed for pain. 10/11/12   Clayton Bibles, PA-C  ibuprofen (ADVIL,MOTRIN) 200 MG tablet Take 800 mg by mouth every 6 (six) hours as needed. For pain.    Historical Provider, MD  ibuprofen (ADVIL,MOTRIN) 800 MG tablet Take 1 tablet (800 mg total) by mouth every 8 (eight) hours as needed for pain. 10/11/12   Clayton Bibles, PA-C   BP 153/105  Pulse 78  Temp(Src) 98.3 F (36.8 C) (Oral)  Resp 20  Ht 6\' 1"  (1.854 m)  Wt 218 lb (98.884 kg)  BMI 28.77 kg/m2  SpO2 99%  Physical Exam General: Well-developed, well-nourished male in no acute distress; appearance consistent with age of record HENT: normocephalic; atraumatic Eyes: Normal appearance Neck: supple Heart: regular rate and rhythm Lungs: Normal respiratory effort and excursion Abdomen: soft; nondistended Extremities: No deformity; full range of motion; pulses normal; edema and tenderness of right forearm; pain on range of motion of right shoulder right elbow; superficial abrasion to dorsal right hand; intact tendon function at right wrist and hand; no right wrist or snuff box tenderness; decreased sensation over right hand Neurologic: Awake, alert and oriented; motor function intact in all extremities and symmetric; no facial droop Skin: Warm and dry Psychiatric: Normal mood and affect    ED Course  Procedures (including critical care time)  MDM  Nursing notes and vitals signs,  including pulse oximetry, reviewed.  Summary of this visit's results, reviewed by myself:  Labs:  No results found for this or any previous visit (from the past 24 hour(s)).  Imaging Studies: Dg Shoulder Right  11/29/2013   CLINICAL DATA:  28 year old male status post lifting injury with acute pain and decreased range of motion. Initial encounter.  EXAM: RIGHT SHOULDER - 2+ VIEW  COMPARISON:  None.  FINDINGS: Bone mineralization is within normal limits. No glenohumeral joint dislocation. Proximal right humerus intact.  Right clavicle and scapula intact. Visible right ribs and lung parenchyma within normal limits.  IMPRESSION: No acute fracture or dislocation identified about the right shoulder.   Electronically Signed   By: Lars Pinks M.D.   On: 11/29/2013 00:50   Dg Elbow Complete Right  11/29/2013   CLINICAL DATA:  28 year old male status post lifting injury with acute pain and decreased range of motion. Initial encounter.  EXAM: RIGHT ELBOW - COMPLETE 3+ VIEW  COMPARISON:  Right shoulder series from the same day reported separately.  FINDINGS: Bone mineralization is within normal limits. No elbow joint effusion. Joint spaces and alignment preserved. No fracture or dislocation.  IMPRESSION: No acute fracture or dislocation identified about the right elbow.   Electronically Signed   By: Lars Pinks M.D.   On: 11/29/2013 00:51   1:22 AM Patient advised of signs and symptoms of compartment syndrome, although the patient's forearm was not crushed. We have advised rest and elevation of the arm and we will refer him to orthopedics.  Wynetta Fines, MD 11/29/13 [phone]

## 2013-11-29 NOTE — ED Notes (Signed)
Pt was carrying a pt on stair chair and chain collapsed pulling against rt arm,  C/o pain to rt arm from shoulder down to hand w numbness in ring and little finger to rt hand,  w swelling from elbow to hand

## 2013-12-31 ENCOUNTER — Ambulatory Visit (HOSPITAL_COMMUNITY)
Admission: RE | Admit: 2013-12-31 | Discharge: 2013-12-31 | Disposition: A | Discharge status: Home / Self Care | Payer: Worker's Compensation | Source: Ambulatory Visit | Attending: Orthopedic Surgery | Admitting: Orthopedic Surgery

## 2013-12-31 ENCOUNTER — Other Ambulatory Visit (HOSPITAL_COMMUNITY): Payer: Self-pay | Admitting: Orthopedic Surgery

## 2013-12-31 DIAGNOSIS — Z139 Encounter for screening, unspecified: Secondary | ICD-10-CM

## 2013-12-31 DIAGNOSIS — Z01818 Encounter for other preprocedural examination: Secondary | ICD-10-CM | POA: Insufficient documentation

## 2014-10-11 ENCOUNTER — Ambulatory Visit (INDEPENDENT_AMBULATORY_CARE_PROVIDER_SITE_OTHER): Payer: BLUE CROSS/BLUE SHIELD | Admitting: Family Medicine

## 2014-10-11 VITALS — BP 120/96 | HR 83 | Temp 98.0°F | Resp 16 | Ht 73.0 in | Wt 241.0 lb

## 2014-10-11 DIAGNOSIS — M545 Low back pain: Secondary | ICD-10-CM | POA: Diagnosis not present

## 2014-10-11 DIAGNOSIS — M62830 Muscle spasm of back: Secondary | ICD-10-CM | POA: Diagnosis not present

## 2014-10-11 MED ORDER — KETOROLAC TROMETHAMINE 60 MG/2ML IM SOLN
60.0000 mg | Freq: Once | INTRAMUSCULAR | Status: AC
  Administered 2014-10-11: 60 mg via INTRAMUSCULAR

## 2014-10-11 MED ORDER — CYCLOBENZAPRINE HCL 5 MG PO TABS: ORAL_TABLET | ORAL | Status: DC

## 2014-10-11 NOTE — Progress Notes (Signed)
Subjective:  This chart was scribed for Glenn Ray, MD by Glenn Burton, ED Scribe. This patient was seen in room 3 and the patient's care was started at 12:01 PM.   Patient ID: Glenn Burton, male    DOB: Nov 09, 1985, 29 y.o.   MRN: [id number]  HPI Chief Complaint  Patient presents with  . Back Pain    Onset yesterday/ took roomates flexeril last night   HPI Comments: Glenn Burton is a 29 y.o. male who presents to the [facility] complaining of mid-low back pain. Pt states when he woke up yesterday morning, before work, he had mid- low back pain. He took ibuprofen but still had unchanged back pain. States after work last night he took his roommates flexeril 5mg  and applied heating pad to back with some relief to pain.  Pt works as an Public relations account executive. He did have a heavy lift at work of 400 lbs a couples days ago but denies injury or fall. He went to the gym a 2 days ago, working out his arms and legs. Pt has had Toradol injection in the past for back spasm with some relief. Pt denies blood in urine, bladder and bowel incontinence, saddle anesthesia, no LE weakness and radiation.    Patient Active Problem List   Diagnosis Date Noted  . Migraine headache 01/19/2011   Past Medical History  Diagnosis Date  . Pericarditis   . Asthma   . Migraine   . Lipoma   . Allergy   . Hypertension   . Pericarditis    Past Surgical History  Procedure Laterality Date  . Tonsillectomy  age 24    tubes in ears  . Tubes     Allergies  Allergen Reactions  . Amoxicillin Other (See Comments)    Childhood Reaction.  . Clindamycin/Lincomycin Rash   Prior to Admission medications   Medication Sig Start Date End Date Taking? Authorizing Provider  ibuprofen (ADVIL,MOTRIN) 200 MG tablet Take 800 mg by mouth every 6 (six) hours as needed. For pain.   Yes Historical Provider, MD  albuterol (PROVENTIL HFA;VENTOLIN HFA) 108 (90 BASE) MCG/ACT inhaler Inhale 2 puffs into the lungs every 6  (six) hours as needed for wheezing.    Historical Provider, MD   Social History   Social History  . Marital Status: Single    Spouse Name: N/A  . Number of Children: N/A  . Years of Education: N/A   Occupational History  . Not on file.   Social History Main Topics  . Smoking status: Former Smoker    Quit date: 04/26/2007  . Smokeless tobacco: Never Used  . Alcohol Use: Yes     Comment: Occasonal  . Drug Use: No  . Sexual Activity: Not on file   Other Topics Concern  . Not on file   Social History Narrative   Review of Systems  Genitourinary: Negative for dysuria and hematuria.  Musculoskeletal: Positive for myalgias and back pain.  Neurological: Negative for weakness and numbness.   Objective:   Physical Exam  Constitutional: He is oriented to person, place, and time. He appears well-developed and well-nourished. No distress.  HENT:  Head: Normocephalic and atraumatic.  Eyes: Conjunctivae and EOM are normal.  Neck: Neck supple.  Cardiovascular: Normal rate.   Pulmonary/Chest: Effort normal.  Musculoskeletal: Normal range of motion.  Slight discomfort with left CVA percussion. pain to paraspinal of upper thoracic and lower lumbar spine. Flexion of lumbar is at 90. Equal lateral rotation  but did reproduce pain with lateral flexion.   Neurological: He is alert and oriented to person, place, and time. He has normal strength. No sensory deficit. He displays no Babinski's sign on the right side. He displays no Babinski's sign on the left side.  Reflex Scores:      Patellar reflexes are 2+ on the right side and 2+ on the left side.      Achilles reflexes are 2+ on the right side and 2+ on the left side. Skin: Skin is warm and dry.  Psychiatric: He has a normal mood and affect. His behavior is normal.  Nursing note and vitals reviewed.  Filed Vitals:   10/11/14 1123  BP: 120/96  Pulse: 83  Temp: 98 F (36.7 C)  TempSrc: Oral  Resp: 16  Height: 6\' 1"  (1.854 m)    Weight: 241 lb (109.317 kg)  SpO2: 98%     Assessment & Plan:   Glenn Burton is a 29 y.o. male Low back pain without sciatica, unspecified back pain laterality - Plan: ketorolac (TORADOL) injection 60 mg  Spasm of back muscles - Plan: cyclobenzaprine (FLEXERIL) 5 MG tablet  Possible overuse or lumbar strain, without known injury. Spasm of musculature and tender along paraspinal muscles, less at true costovertebral angle. No urinary symptoms, so doubt kidney stone, doubt infection. Will treat for spasm with Flexeril up to every 8 hours, Toradol injection given as this was helpful for his back pain in the past. Advised no further NSAIDs today. If not improving the next 3 days, or any urinary symptoms, did recommend return for repeat evaluation and urine testing. Symptomatic care discussed.  Meds ordered this encounter  Medications  . ketorolac (TORADOL) injection 60 mg    Sig:   . cyclobenzaprine (FLEXERIL) 5 MG tablet    Sig: 1 to 2 tabs by mouth up to every 8 hours as needed. Start with one by mouth each bedtime as needed due to sedation    Dispense:  20 tablet    Refill:  0   Patient Instructions  Toward all injection was given today, so do not take any other anti-inflammatories like Advil or Aleve until tomorrow. You can take the Flexeril 1-2 tablets up to every 8 hours, but start this at bedtime due to sedation. Okay to apply heat or ice, stretches, range of motion as tolerated. If you have any urinary symptoms including any blood in the urine, returnto have a urine test performed as rarely kidney stones could present this way. If you're not improving in the next 3 days, return for recheck. Sooner if worse.  Return to the clinic or go to the nearest emergency room if any of your symptoms worsen or new symptoms occur.     I personally performed the services described in this documentation, which was scribed in my presence. The recorded information has been reviewed and  considered, and addended by me as needed.

## 2014-10-11 NOTE — Patient Instructions (Signed)
Toward all injection was given today, so do not take any other anti-inflammatories like Advil or Aleve until tomorrow. You can take the Flexeril 1-2 tablets up to every 8 hours, but start this at bedtime due to sedation. Okay to apply heat or ice, stretches, range of motion as tolerated. If you have any urinary symptoms including any blood in the urine, returnto have a urine test performed as rarely kidney stones could present this way. If you're not improving in the next 3 days, return for recheck. Sooner if worse.  Return to the clinic or go to the nearest emergency room if any of your symptoms worsen or new symptoms occur.

## 2015-01-29 ENCOUNTER — Emergency Department (HOSPITAL_COMMUNITY): Payer: BLUE CROSS/BLUE SHIELD

## 2015-01-29 ENCOUNTER — Encounter (HOSPITAL_COMMUNITY): Payer: Self-pay | Admitting: Emergency Medicine

## 2015-01-29 ENCOUNTER — Emergency Department (HOSPITAL_COMMUNITY)
Admission: EM | Admit: 2015-01-29 | Discharge: 2015-01-29 | Disposition: A | Discharge status: Home / Self Care | Payer: BLUE CROSS/BLUE SHIELD | Attending: Emergency Medicine | Admitting: Emergency Medicine

## 2015-01-29 DIAGNOSIS — Z79899 Other long term (current) drug therapy: Secondary | ICD-10-CM | POA: Insufficient documentation

## 2015-01-29 DIAGNOSIS — I10 Essential (primary) hypertension: Secondary | ICD-10-CM | POA: Insufficient documentation

## 2015-01-29 DIAGNOSIS — J45909 Unspecified asthma, uncomplicated: Secondary | ICD-10-CM | POA: Insufficient documentation

## 2015-01-29 DIAGNOSIS — M62830 Muscle spasm of back: Secondary | ICD-10-CM

## 2015-01-29 DIAGNOSIS — Y998 Other external cause status: Secondary | ICD-10-CM | POA: Insufficient documentation

## 2015-01-29 DIAGNOSIS — Z880 Allergy status to penicillin: Secondary | ICD-10-CM | POA: Insufficient documentation

## 2015-01-29 DIAGNOSIS — S99911A Unspecified injury of right ankle, initial encounter: Secondary | ICD-10-CM | POA: Diagnosis not present

## 2015-01-29 DIAGNOSIS — Z86018 Personal history of other benign neoplasm: Secondary | ICD-10-CM | POA: Insufficient documentation

## 2015-01-29 DIAGNOSIS — Y9389 Activity, other specified: Secondary | ICD-10-CM | POA: Insufficient documentation

## 2015-01-29 DIAGNOSIS — Y92410 Unspecified street and highway as the place of occurrence of the external cause: Secondary | ICD-10-CM | POA: Diagnosis not present

## 2015-01-29 DIAGNOSIS — Z87891 Personal history of nicotine dependence: Secondary | ICD-10-CM | POA: Insufficient documentation

## 2015-01-29 DIAGNOSIS — S6991XA Unspecified injury of right wrist, hand and finger(s), initial encounter: Secondary | ICD-10-CM | POA: Diagnosis not present

## 2015-01-29 DIAGNOSIS — S134XXA Sprain of ligaments of cervical spine, initial encounter: Secondary | ICD-10-CM | POA: Diagnosis not present

## 2015-01-29 DIAGNOSIS — G43909 Migraine, unspecified, not intractable, without status migrainosus: Secondary | ICD-10-CM | POA: Diagnosis not present

## 2015-01-29 DIAGNOSIS — S0990XA Unspecified injury of head, initial encounter: Secondary | ICD-10-CM | POA: Insufficient documentation

## 2015-01-29 DIAGNOSIS — S199XXA Unspecified injury of neck, initial encounter: Secondary | ICD-10-CM | POA: Diagnosis present

## 2015-01-29 MED ORDER — IBUPROFEN 800 MG PO TABS: 800.0000 mg | ORAL_TABLET | Freq: Three times a day (TID) | ORAL | Status: DC | PRN

## 2015-01-29 MED ORDER — CYCLOBENZAPRINE HCL 5 MG PO TABS: ORAL_TABLET | ORAL | Status: DC

## 2015-01-29 MED ORDER — IBUPROFEN 800 MG PO TABS
800.0000 mg | ORAL_TABLET | Freq: Once | ORAL | Status: AC
  Administered 2015-01-29: 800 mg via ORAL
  Filled 2015-01-29: qty 1

## 2015-01-29 NOTE — Discharge Instructions (Signed)
Cervical Strain and Sprain With Rehab  Cervical strain and sprain are injuries that commonly occur with "whiplash" injuries. Whiplash occurs when the neck is forcefully whipped backward or forward, such as during a motor vehicle accident or during contact sports. The muscles, ligaments, tendons, discs, and nerves of the neck are susceptible to injury when this occurs.  RISK FACTORS  Risk of having a whiplash injury increases if:  · Osteoarthritis of the spine.  · Situations that make head or neck accidents or trauma more likely.  · High-risk sports (football, rugby, wrestling, hockey, auto racing, gymnastics, diving, contact karate, or boxing).  · Poor strength and flexibility of the neck.  · Previous neck injury.  · Poor tackling technique.  · Improperly fitted or padded equipment.  SYMPTOMS   · Pain or stiffness in the front or back of neck or both.  · Symptoms may present immediately or up to 24 hours after injury.  · Dizziness, headache, nausea, and vomiting.  · Muscle spasm with soreness and stiffness in the neck.  · Tenderness and swelling at the injury site.  PREVENTION  · Learn and use proper technique (avoid tackling with the head, spearing, and head-butting; use proper falling techniques to avoid landing on the head).  · Warm up and stretch properly before activity.  · Maintain physical fitness:    Strength, flexibility, and endurance.    Cardiovascular fitness.  · Wear properly fitted and padded protective equipment, such as padded soft collars, for participation in contact sports.  PROGNOSIS   Recovery from cervical strain and sprain injuries is dependent on the extent of the injury. These injuries are usually curable in 1 week to 3 months with appropriate treatment.   RELATED COMPLICATIONS   · Temporary numbness and weakness may occur if the nerve roots are damaged, and this may persist until the nerve has completely healed.  · Chronic pain due to frequent recurrence of symptoms.  · Prolonged healing,  especially if activity is resumed too soon (before complete recovery).  TREATMENT   Treatment initially involves the use of ice and medication to help reduce pain and inflammation. It is also important to perform strengthening and stretching exercises and modify activities that worsen symptoms so the injury does not get worse. These exercises may be performed at home or with a therapist. For patients who experience severe symptoms, a soft, padded collar may be recommended to be worn around the neck.   Improving your posture may help reduce symptoms. Posture improvement includes pulling your chin and abdomen in while sitting or standing. If you are sitting, sit in a firm chair with your buttocks against the back of the chair. While sleeping, try replacing your pillow with a small towel rolled to 2 inches in diameter, or use a cervical pillow or soft cervical collar. Poor sleeping positions delay healing.   For patients with nerve root damage, which causes numbness or weakness, the use of a cervical traction apparatus may be recommended. Surgery is rarely necessary for these injuries. However, cervical strain and sprains that are present at birth (congenital) may require surgery.  MEDICATION   · If pain medication is necessary, nonsteroidal anti-inflammatory medications, such as aspirin and ibuprofen, or other minor pain relievers, such as acetaminophen, are often recommended.  · Do not take pain medication for 7 days before surgery.  · Prescription pain relievers may be given if deemed necessary by your caregiver. Use only as directed and only as much as you need.    HEAT AND COLD:   · Cold treatment (icing) relieves pain and reduces inflammation. Cold treatment should be applied for 10 to 15 minutes every 2 to 3 hours for inflammation and pain and immediately after any activity that aggravates your symptoms. Use ice packs or an ice massage.  · Heat treatment may be used prior to performing the stretching and  strengthening activities prescribed by your caregiver, physical therapist, or athletic trainer. Use a heat pack or a warm soak.  SEEK MEDICAL CARE IF:   · Symptoms get worse or do not improve in 2 weeks despite treatment.  · New, unexplained symptoms develop (drugs used in treatment may produce side effects).  EXERCISES  RANGE OF MOTION (ROM) AND STRETCHING EXERCISES - Cervical Strain and Sprain  These exercises may help you when beginning to rehabilitate your injury. In order to successfully resolve your symptoms, you must improve your posture. These exercises are designed to help reduce the forward-head and rounded-shoulder posture which contributes to this condition. Your symptoms may resolve with or without further involvement from your physician, physical therapist or athletic trainer. While completing these exercises, remember:   · Restoring tissue flexibility helps normal motion to return to the joints. This allows healthier, less painful movement and activity.  · An effective stretch should be held for at least 20 seconds, although you may need to begin with shorter hold times for comfort.  · A stretch should never be painful. You should only feel a gentle lengthening or release in the stretched tissue.  STRETCH- Axial Extensors  · Lie on your back on the floor. You may bend your knees for comfort. Place a rolled-up hand towel or dish towel, about 2 inches in diameter, under the part of your head that makes contact with the floor.  · Gently tuck your chin, as if trying to make a "double chin," until you feel a gentle stretch at the base of your head.  · Hold __________ seconds.  Repeat __________ times. Complete this exercise __________ times per day.   STRETCH - Axial Extension   · Stand or sit on a firm surface. Assume a good posture: chest up, shoulders drawn back, abdominal muscles slightly tense, knees unlocked (if standing) and feet hip width apart.  · Slowly retract your chin so your head slides back  and your chin slightly lowers. Continue to look straight ahead.  · You should feel a gentle stretch in the back of your head. Be certain not to feel an aggressive stretch since this can cause headaches later.  · Hold for __________ seconds.  Repeat __________ times. Complete this exercise __________ times per day.  STRETCH - Cervical Side Bend   · Stand or sit on a firm surface. Assume a good posture: chest up, shoulders drawn back, abdominal muscles slightly tense, knees unlocked (if standing) and feet hip width apart.  · Without letting your nose or shoulders move, slowly tip your right / left ear to your shoulder until your feel a gentle stretch in the muscles on the opposite side of your neck.  · Hold __________ seconds.  Repeat __________ times. Complete this exercise __________ times per day.  STRETCH - Cervical Rotators   · Stand or sit on a firm surface. Assume a good posture: chest up, shoulders drawn back, abdominal muscles slightly tense, knees unlocked (if standing) and feet hip width apart.  · Keeping your eyes level with the ground, slowly turn your head until you feel a gentle stretch along   the back and opposite side of your neck.  · Hold __________ seconds.  Repeat __________ times. Complete this exercise __________ times per day.  RANGE OF MOTION - Neck Circles   · Stand or sit on a firm surface. Assume a good posture: chest up, shoulders drawn back, abdominal muscles slightly tense, knees unlocked (if standing) and feet hip width apart.  · Gently roll your head down and around from the back of one shoulder to the back of the other. The motion should never be forced or painful.  · Repeat the motion 10-20 times, or until you feel the neck muscles relax and loosen.  Repeat __________ times. Complete the exercise __________ times per day.  STRENGTHENING EXERCISES - Cervical Strain and Sprain  These exercises may help you when beginning to rehabilitate your injury. They may resolve your symptoms with or  without further involvement from your physician, physical therapist, or athletic trainer. While completing these exercises, remember:   · Muscles can gain both the endurance and the strength needed for everyday activities through controlled exercises.  · Complete these exercises as instructed by your physician, physical therapist, or athletic trainer. Progress the resistance and repetitions only as guided.  · You may experience muscle soreness or fatigue, but the pain or discomfort you are trying to eliminate should never worsen during these exercises. If this pain does worsen, stop and make certain you are following the directions exactly. If the pain is still present after adjustments, discontinue the exercise until you can discuss the trouble with your clinician.  STRENGTH - Cervical Flexors, Isometric  · Face a wall, standing about 6 inches away. Place a small pillow, a ball about 6-8 inches in diameter, or a folded towel between your forehead and the wall.  · Slightly tuck your chin and gently push your forehead into the soft object. Push only with mild to moderate intensity, building up tension gradually. Keep your jaw and forehead relaxed.  · Hold 10 to 20 seconds. Keep your breathing relaxed.  · Release the tension slowly. Relax your neck muscles completely before you start the next repetition.  Repeat __________ times. Complete this exercise __________ times per day.  STRENGTH- Cervical Lateral Flexors, Isometric   · Stand about 6 inches away from a wall. Place a small pillow, a ball about 6-8 inches in diameter, or a folded towel between the side of your head and the wall.  · Slightly tuck your chin and gently tilt your head into the soft object. Push only with mild to moderate intensity, building up tension gradually. Keep your jaw and forehead relaxed.  · Hold 10 to 20 seconds. Keep your breathing relaxed.  · Release the tension slowly. Relax your neck muscles completely before you start the next  repetition.  Repeat __________ times. Complete this exercise __________ times per day.  STRENGTH - Cervical Extensors, Isometric   · Stand about 6 inches away from a wall. Place a small pillow, a ball about 6-8 inches in diameter, or a folded towel between the back of your head and the wall.  · Slightly tuck your chin and gently tilt your head back into the soft object. Push only with mild to moderate intensity, building up tension gradually. Keep your jaw and forehead relaxed.  · Hold 10 to 20 seconds. Keep your breathing relaxed.  · Release the tension slowly. Relax your neck muscles completely before you start the next repetition.  Repeat __________ times. Complete this exercise __________ times per day.    All of your joints have less wear and tear when properly supported by a spine with good posture. This means you will experience a healthier, less painful body. °· Correct posture must be practiced with all of your activities, especially prolonged sitting and standing. Correct posture is as important when doing repetitive low-stress activities (typing) as it is when doing a single heavy-load activity (lifting). °PROLONGED STANDING WHILE SLIGHTLY LEANING FORWARD °When completing a task that requires you to lean forward while standing in one  place for a long time, place either foot up on a stationary 2- to 4-inch high object to help maintain the best posture. When both feet are on the ground, the low back tends to lose its slight inward curve. If this curve flattens (or becomes too large), then the back and your other joints will experience too much stress, fatigue more quickly, and can cause pain.  °RESTING POSITIONS °Consider which positions are most painful for you when choosing a resting position. If you have pain with flexion-based activities (sitting, bending, stooping, squatting), choose a position that allows you to rest in a less flexed posture. You would want to avoid curling into a fetal position on your side. If your pain worsens with extension-based activities (prolonged standing, working overhead), avoid resting in an extended position such as sleeping on your stomach. Most people will find more comfort when they rest with their spine in a more neutral position, neither too rounded nor too arched. Lying on a non-sagging bed on your side with a pillow between your knees, or on your back with a pillow under your knees will often provide some relief. Keep in mind, being in any one position for a prolonged period of time, no matter how correct your posture, can still lead to stiffness. °WALKING °Walk with an upright posture. Your ears, shoulders, and hips should all line up. °OFFICE WORK °When working at a desk, create an environment that supports good, upright posture. Without extra support, muscles fatigue and lead to excessive strain on joints and other tissues. °CHAIR: °· A chair should be able to slide under your desk when your back makes contact with the back of the chair. This allows you to work closely. °· The chair's height should allow your eyes to be level with the upper part of your monitor and your hands to be slightly lower than your elbows. °· Body position: °¨ Your feet should make contact with the floor. If this is not  possible, use a foot rest. °¨ Keep your ears over your shoulders. This will reduce stress on your neck and low back. °  °This information is not intended to replace advice given to you by your health care provider. Make sure you discuss any questions you have with your health care provider. °  °Document Released: 01/25/2005 Document Revised: 02/15/2014 Document Reviewed: 05/09/2008 °Elsevier Interactive Patient Education ©2016 [employer] °Motor Vehicle Collision °It is common to have multiple bruises and sore muscles after a motor vehicle collision (MVC). These tend to feel worse for the first 24 hours. You may have the most stiffness and soreness over the first several hours. You may also feel worse when you wake up the first morning after your collision. After this point, you will usually begin to improve with each day. The speed of improvement often depends on the severity of the collision, the number of injuries, and the location and nature of these injuries. °HOME CARE INSTRUCTIONS °· Put ice on the injured area. °·   Put ice in a plastic bag. °· Place a towel between your skin and the bag. °· Leave the ice on for 15-20 minutes, 3-4 times a day, or as directed by your health care provider. °· Drink enough fluids to keep your urine clear or pale yellow. Do not drink alcohol. °· Take a warm shower or bath once or twice a day. This will increase blood flow to sore muscles. °· You may return to activities as directed by your caregiver. Be careful when lifting, as this may aggravate neck or back pain. °· Only take over-the-counter or prescription medicines for pain, discomfort, or fever as directed by your caregiver. Do not use aspirin. This may increase bruising and bleeding. °SEEK IMMEDIATE MEDICAL CARE IF: °· You have numbness, tingling, or weakness in the arms or legs. °· You develop severe headaches not relieved with medicine. °· You have severe neck pain, especially tenderness in the middle of the back of  your neck. °· You have changes in bowel or bladder control. °· There is increasing pain in any area of the body. °· You have shortness of breath, light-headedness, dizziness, or fainting. °· You have chest pain. °· You feel sick to your stomach (nauseous), throw up (vomit), or sweat. °· You have increasing abdominal discomfort. °· There is blood in your urine, stool, or vomit. °· You have pain in your shoulder (shoulder strap areas). °· You feel your symptoms are getting worse. °MAKE SURE YOU: °· Understand these instructions. °· Will watch your condition. °· Will get help right away if you are not doing well or get worse. °  °This information is not intended to replace advice given to you by your health care provider. Make sure you discuss any questions you have with your health care provider. °  °Document Released: 01/25/2005 Document Revised: 02/15/2014 Document Reviewed: 06/24/2010 °Elsevier Interactive Patient Education ©2016 [employer] ° °

## 2015-01-29 NOTE — ED Notes (Signed)
Bed: WA29 Expected date:  Expected time:  Means of arrival:  Comments: Hold for [employer]
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
## 2015-01-29 NOTE — ED Notes (Signed)
Patient transported to X-ray 

## 2015-02-26 ENCOUNTER — Emergency Department (HOSPITAL_COMMUNITY)
Admission: EM | Admit: 2015-02-26 | Discharge: 2015-02-26 | Disposition: A | Discharge status: Home / Self Care | Payer: BLUE CROSS/BLUE SHIELD | Attending: Emergency Medicine | Admitting: Emergency Medicine

## 2015-02-26 ENCOUNTER — Encounter (HOSPITAL_COMMUNITY): Payer: Self-pay

## 2015-02-26 ENCOUNTER — Emergency Department (HOSPITAL_COMMUNITY): Payer: BLUE CROSS/BLUE SHIELD

## 2015-02-26 DIAGNOSIS — Z87891 Personal history of nicotine dependence: Secondary | ICD-10-CM | POA: Insufficient documentation

## 2015-02-26 DIAGNOSIS — Z880 Allergy status to penicillin: Secondary | ICD-10-CM | POA: Insufficient documentation

## 2015-02-26 DIAGNOSIS — R110 Nausea: Secondary | ICD-10-CM | POA: Diagnosis not present

## 2015-02-26 DIAGNOSIS — R5383 Other fatigue: Secondary | ICD-10-CM | POA: Diagnosis not present

## 2015-02-26 DIAGNOSIS — G43909 Migraine, unspecified, not intractable, without status migrainosus: Secondary | ICD-10-CM | POA: Insufficient documentation

## 2015-02-26 DIAGNOSIS — I10 Essential (primary) hypertension: Secondary | ICD-10-CM | POA: Diagnosis not present

## 2015-02-26 DIAGNOSIS — R197 Diarrhea, unspecified: Secondary | ICD-10-CM | POA: Diagnosis not present

## 2015-02-26 DIAGNOSIS — Z86018 Personal history of other benign neoplasm: Secondary | ICD-10-CM | POA: Insufficient documentation

## 2015-02-26 DIAGNOSIS — R000 Tachycardia, unspecified: Secondary | ICD-10-CM | POA: Diagnosis not present

## 2015-02-26 DIAGNOSIS — R079 Chest pain, unspecified: Secondary | ICD-10-CM | POA: Diagnosis not present

## 2015-02-26 DIAGNOSIS — J45909 Unspecified asthma, uncomplicated: Secondary | ICD-10-CM | POA: Diagnosis not present

## 2015-02-26 LAB — BASIC METABOLIC PANEL
ANION GAP: 13 (ref 5–15)
BUN: 16 mg/dL (ref 6–20)
CO2: 23 mmol/L (ref 22–32)
Calcium: 9.5 mg/dL (ref 8.9–10.3)
Chloride: 101 mmol/L (ref 101–111)
Creatinine, Ser: 1.15 mg/dL (ref 0.61–1.24)
GFR calc Af Amer: >60 mL/min (ref >60)
GFR calc non Af Amer: >60 mL/min (ref >60)
GLUCOSE: 108 mg/dL — AB (ref 65–99)
POTASSIUM: 4.2 mmol/L (ref 3.5–5.1)
Sodium: 137 mmol/L (ref 135–145)

## 2015-02-26 LAB — URINALYSIS, ROUTINE W REFLEX MICROSCOPIC
Bilirubin Urine: NEGATIVE
Glucose, UA: NEGATIVE mg/dL
Hgb urine dipstick: NEGATIVE
Ketones, ur: NEGATIVE mg/dL
LEUKOCYTES UA: NEGATIVE
NITRITE: NEGATIVE
PH: 5.5 (ref 5.0–8.0)
Protein, ur: NEGATIVE mg/dL
SPECIFIC GRAVITY, URINE: 1.031 — AB (ref 1.005–1.030)

## 2015-02-26 LAB — I-STAT TROPONIN, ED: Troponin i, poc: 0 ng/mL (ref 0.00–0.08)

## 2015-02-26 LAB — CBC
HEMATOCRIT: 46.3 % (ref 39.0–52.0)
HEMOGLOBIN: 16.5 g/dL (ref 13.0–17.0)
MCH: 31.3 pg (ref 26.0–34.0)
MCHC: 35.6 g/dL (ref 30.0–36.0)
MCV: 87.9 fL (ref 78.0–100.0)
Platelets: 212 10*3/uL (ref 150–400)
RBC: 5.27 MIL/uL (ref 4.22–5.81)
RDW: 12.7 % (ref 11.5–15.5)
WBC: 16.1 10*3/uL — ABNORMAL HIGH (ref 4.0–10.5)

## 2015-02-26 LAB — LIPASE, BLOOD: Lipase: 22 U/L (ref 11–51)

## 2015-02-26 MED ORDER — ONDANSETRON 4 MG PO TBDP: 4.0000 mg | ORAL_TABLET | Freq: Three times a day (TID) | ORAL | Status: DC | PRN

## 2015-02-26 MED ORDER — SODIUM CHLORIDE 0.9 % IV BOLUS (SEPSIS)
1000.0000 mL | Freq: Once | INTRAVENOUS | Status: AC
  Administered 2015-02-26: 1000 mL via INTRAVENOUS

## 2015-02-26 MED ORDER — ONDANSETRON HCL 4 MG/2ML IJ SOLN
4.0000 mg | Freq: Once | INTRAMUSCULAR | Status: AC
  Administered 2015-02-26: 4 mg via INTRAVENOUS
  Filled 2015-02-26: qty 2

## 2015-02-26 MED ORDER — ONDANSETRON 4 MG PO TBDP
4.0000 mg | ORAL_TABLET | Freq: Once | ORAL | Status: AC
  Administered 2015-02-26: 4 mg via ORAL
  Filled 2015-02-26: qty 1

## 2015-02-26 NOTE — ED Notes (Signed)
Pt given urinal and is aware that we need a urine sample.

## 2015-02-26 NOTE — ED Notes (Addendum)
Pt here for tachycardia. States he checked his HR at 1555 and noticed his HR was 160. He reports a tightness in his chest. Hx of pericarditis x 2. Pt clammy at triage. States he woke up this morning with N/VD.

## 2015-02-26 NOTE — ED Provider Notes (Signed)
CSN: [id number]     Arrival date & time 02/26/15  1656 History   First MD Initiated Contact with Patient 02/26/15 2030     Chief Complaint  Patient presents with  . Tachycardia     (Consider location/radiation/quality/duration/timing/severity/associated sxs/prior Treatment) The history is provided by the patient and the spouse. No language interpreter was used.   Patient is a 30 year old male with past medical history of pericarditis who presents today with nausea and diarrhea. States this started this morning. Has also experienced malaise with this. Denies any known sick contacts although he works as an Primary school teacher. Has not had fevers, chills, abdominal pain with this. Has had decreased appetite. Had two episodes of loose/watery diarrhea. Denies any blood present. Has not had vomiting. Presents today because he was concerned that his heart rate was fast at home. States he has tried to drink water but hasn't been able to keep much down. No other clear worsening or alleviating factors.   Past Medical History  Diagnosis Date  . Pericarditis   . Asthma   . Migraine   . Lipoma   . Allergy   . Hypertension   . Pericarditis    Past Surgical History  Procedure Laterality Date  . Tonsillectomy  age 89    tubes in ears  . Tubes     Family History  Problem Relation Age of Onset  . Migraines Mother   . Hyperlipidemia Mother   . Hypertension Mother   . Diabetes Father   . Cancer Father   . Hypertension Father   . Coronary artery disease Father   . Heart disease Father   . Hyperlipidemia Father   . Heart disease Paternal Grandmother   . Cancer Paternal Grandfather    Social History  Substance Use Topics  . Smoking status: Former Smoker    Quit date: 04/26/2007  . Smokeless tobacco: Never Used  . Alcohol Use: Yes     Comment: Occasonal    Review of Systems  Constitutional: Positive for fatigue. Negative for fever and chills.  HENT: Negative for congestion and rhinorrhea.    Respiratory: Positive for chest tightness. Negative for cough and shortness of breath.   Gastrointestinal: Positive for nausea and diarrhea. Negative for vomiting, abdominal pain and blood in stool.  Genitourinary: Negative for dysuria and hematuria.  All other systems reviewed and are negative.     Allergies  Amoxicillin; Penicillins; and Clindamycin/lincomycin  Home Medications   Prior to Admission medications   Medication Sig Start Date End Date Taking? Authorizing Provider  cyclobenzaprine (FLEXERIL) 5 MG tablet 1 to 2 tabs by mouth up to every 8 hours as needed. Start with one by mouth each bedtime as needed due to sedation Patient taking differently: Take 5-10 mg by mouth 3 (three) times daily as needed for muscle spasms. 1 to 2 tabs by mouth up to every 8 hours as needed. Start with one by mouth each bedtime as needed due to sedation 01/29/15  Yes Domenic Moras, PA-C  ibuprofen (ADVIL,MOTRIN) 800 MG tablet Take 1 tablet (800 mg total) by mouth every 8 (eight) hours as needed for moderate pain. Patient not taking: Reported on 02/26/2015 01/29/15   Domenic Moras, PA-C  ondansetron (ZOFRAN ODT) 4 MG disintegrating tablet Take 1 tablet (4 mg total) by mouth every 8 (eight) hours as needed for nausea or vomiting. 02/26/15   Theodosia Quay, MD   BP 137/91 mmHg  Pulse 105  Temp(Src) 98.3 F (36.8 C) (Oral)  Resp 28  SpO2 99% Physical Exam  Constitutional: He is oriented to person, place, and time. He appears well-developed and well-nourished. No distress.  HENT:  Head: Normocephalic and atraumatic.  Eyes: Right eye exhibits no discharge. Left eye exhibits no discharge.  Neck: Normal range of motion. Neck supple.  Cardiovascular: Regular rhythm.  Tachycardia present.   Pulmonary/Chest: Effort normal and breath sounds normal. No respiratory distress. He has no wheezes.  Abdominal: Soft. He exhibits no distension. There is no tenderness.  Musculoskeletal: Normal range of motion.   Neurological: He is alert and oriented to person, place, and time.  Skin: Skin is warm and dry. He is not diaphoretic.  Psychiatric: He has a normal mood and affect. His behavior is normal.  Nursing note and vitals reviewed.   ED Course  Procedures (including critical care time) Labs Review Labs Reviewed  BASIC METABOLIC PANEL - Abnormal; Notable for the following:    Glucose, Bld 108 (*)    All other components within normal limits  CBC - Abnormal; Notable for the following:    WBC 16.1 (*)    All other components within normal limits  URINALYSIS, ROUTINE W REFLEX MICROSCOPIC (NOT AT [facility]) - Abnormal; Notable for the following:    Specific Gravity, Urine 1.031 (*)    All other components within normal limits  LIPASE, BLOOD  I-STAT TROPOININ, ED    Imaging Review Dg Chest 2 View  02/26/2015  CLINICAL DATA:  Chest tightness and tachycardia. EXAM: CHEST  2 VIEW COMPARISON:  Chest x-rays dated 12/01/2007 and 03/20/2006. FINDINGS: The heart size and mediastinal contours are within normal limits. Both lungs are clear. The visualized skeletal structures are unremarkable. IMPRESSION: No active cardiopulmonary disease. Electronically Signed   By: Franki Cabot M.D.   On: 02/26/2015 17:51   I have personally reviewed and evaluated these images and lab results as part of my medical decision-making.   EKG Interpretation   Date/Time:  Wednesday February 26 2015 17:05:49 EST Ventricular Rate:  126 PR Interval:  140 QRS Duration: 76 QT Interval:  288 QTC Calculation: 417 R Axis:   14 Text Interpretation:  Sinus tachycardia Possible Inferior infarct , age  undetermined Abnormal ECG ED PHYSICIAN INTERPRETATION AVAILABLE IN [place]  [place] Confirmed by TEST, Record ([id number]) on 02/27/2015 6:40:21 AM      MDM   Final diagnoses:  Nausea in adult  Diarrhea in adult patient  Tachycardia    Patient is a 30 year old male with past medical history of pericarditis who presents today with  nausea and diarrhea. On exam, pt in no acute distress but notably tachycardic. Abd is soft, non-tender. No CVA tenderness but pt complains of pain in mid back. Obtained Lipase which is wnl -- doubt pancreatitis. UA obtained, no evidence of UTI. CBC shows WBC 16.1 -- likely response to acute illness. Otherwise, labs are unremarkable. Tachycardia significantly improved with 2 liters of NS boluses. Pt given zofran with improvement in symptoms, able to tolerate PO challenge. Discharged home with short course of zofran in good condition.    Theodosia Quay, MD [id number] [id number]  David Glick, MD [id number]

## 2016-02-07 ENCOUNTER — Emergency Department (HOSPITAL_COMMUNITY)
Admission: EM | Admit: 2016-02-07 | Discharge: 2016-02-07 | Disposition: A | Discharge status: Home / Self Care | Payer: Worker's Compensation | Attending: Emergency Medicine | Admitting: Emergency Medicine

## 2016-02-07 ENCOUNTER — Encounter (HOSPITAL_COMMUNITY): Payer: Self-pay | Admitting: Emergency Medicine

## 2016-02-07 ENCOUNTER — Emergency Department (HOSPITAL_COMMUNITY): Payer: Worker's Compensation

## 2016-02-07 DIAGNOSIS — X58XXXA Exposure to other specified factors, initial encounter: Secondary | ICD-10-CM | POA: Insufficient documentation

## 2016-02-07 DIAGNOSIS — I10 Essential (primary) hypertension: Secondary | ICD-10-CM | POA: Insufficient documentation

## 2016-02-07 DIAGNOSIS — Y939 Activity, unspecified: Secondary | ICD-10-CM | POA: Insufficient documentation

## 2016-02-07 DIAGNOSIS — M546 Pain in thoracic spine: Secondary | ICD-10-CM | POA: Diagnosis present

## 2016-02-07 DIAGNOSIS — Y990 Civilian activity done for income or pay: Secondary | ICD-10-CM | POA: Insufficient documentation

## 2016-02-07 DIAGNOSIS — Y929 Unspecified place or not applicable: Secondary | ICD-10-CM | POA: Diagnosis not present

## 2016-02-07 DIAGNOSIS — Z87891 Personal history of nicotine dependence: Secondary | ICD-10-CM | POA: Diagnosis not present

## 2016-02-07 DIAGNOSIS — J45909 Unspecified asthma, uncomplicated: Secondary | ICD-10-CM | POA: Diagnosis not present

## 2016-02-07 MED ORDER — KETOROLAC TROMETHAMINE 60 MG/2ML IM SOLN
60.0000 mg | Freq: Once | INTRAMUSCULAR | Status: AC
  Administered 2016-02-07: 60 mg via INTRAMUSCULAR
  Filled 2016-02-07: qty 2

## 2016-02-07 MED ORDER — CYCLOBENZAPRINE HCL 10 MG PO TABS: 10.0000 mg | ORAL_TABLET | Freq: Two times a day (BID) | ORAL | 0 refills | Status: DC | PRN

## 2016-02-07 MED ORDER — HYDROCODONE-ACETAMINOPHEN 5-325 MG PO TABS: 1.0000 | ORAL_TABLET | ORAL | 0 refills | Status: DC | PRN

## 2016-02-07 MED ORDER — DEXAMETHASONE SODIUM PHOSPHATE 10 MG/ML IJ SOLN
10.0000 mg | Freq: Once | INTRAMUSCULAR | Status: AC
  Administered 2016-02-07: 10 mg via INTRAMUSCULAR
  Filled 2016-02-07: qty 1

## 2016-02-07 NOTE — ED Triage Notes (Addendum)
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 States pain increases with movement.

## 2016-02-07 NOTE — ED Notes (Signed)
Bed: WTR7 Expected date:  Expected time:  Means of arrival:  Comments: 

## 2016-02-07 NOTE — ED Provider Notes (Signed)
[facility] DEPT Provider Note   CSN: [id number] Arrival date & time: 02/07/16  1419  By signing my name below, I, Higinio Plan, attest that this documentation has been prepared under the direction and in the presence of Quincy Carnes, PA-C.  Electronically Signed: Higinio Plan, ED Scribe. 02/07/16. 3:03 PM.  History   Chief Complaint Chief Complaint  Patient presents with  . Back Pain   The history is provided by the patient. No language interpreter was used.   HPI Comments: Dayln Yust is a 30 y.o. male with PMHx of HTN, who presents to the Emergency Department complaining of sudden onset, intermittent, "sharp," back pain that began a few minutes PTA while at work. 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 States the pain takes his breath away when it is severe, seems to come in waves.  States worse on the right side, more pain with movement of the right arm.  States better with applied pressure to the back, worse with lying down or sitting straight upright. Pt notes hx of sciatica but states his current pain does not feel similar. He denies any numbness/weakness of his arms or legs.  No bowel or bladder incontinence.  No hx of back issues or surgeries. No meds tried PTA.   Past Medical History:  Diagnosis Date  . Allergy   . Asthma   . Hypertension   . Lipoma   . Migraine   . Pericarditis   . Pericarditis    Patient Active Problem List   Diagnosis Date Noted  . Migraine headache 01/19/2011    Past Surgical History:  Procedure Laterality Date  . TONSILLECTOMY  age 38   tubes in ears  . tubes      Home Medications    Prior to Admission medications   Medication Sig Start Date End Date Taking? Authorizing Provider  cyclobenzaprine (FLEXERIL) 5 MG tablet 1 to 2 tabs by mouth up to every 8 hours as needed. Start with one by mouth each bedtime as needed due to sedation Patient taking  differently: Take 5-10 mg by mouth 3 (three) times daily as needed for muscle spasms. 1 to 2 tabs by mouth up to every 8 hours as needed. Start with one by mouth each bedtime as needed due to sedation 01/29/15   Domenic Moras, PA-C  ibuprofen (ADVIL,MOTRIN) 800 MG tablet Take 1 tablet (800 mg total) by mouth every 8 (eight) hours as needed for moderate pain. Patient not taking: Reported on 02/26/2015 01/29/15   Domenic Moras, PA-C  ondansetron (ZOFRAN ODT) 4 MG disintegrating tablet Take 1 tablet (4 mg total) by mouth every 8 (eight) hours as needed for nausea or vomiting. 02/26/15   Theodosia Quay, MD    Family History Family History  Problem Relation Age of Onset  . Migraines Mother   . Hyperlipidemia Mother   . Hypertension Mother   . Diabetes Father   . Cancer Father   . Hypertension Father   . Coronary artery disease Father   . Heart disease Father   . Hyperlipidemia Father   . Heart disease Paternal Grandmother   . Cancer Paternal Grandfather     Social History Social History  Substance Use Topics  . Smoking status: Former Smoker    Quit date: 04/26/2007  . Smokeless tobacco: Never Used  . Alcohol use Yes     Comment: Occasonal     Allergies  Amoxicillin; Penicillins; and Clindamycin/lincomycin   Review of Systems Review of Systems  Musculoskeletal: Positive for back pain.  Neurological: Negative for numbness.  All other systems reviewed and are negative.  Physical Exam Updated Vital Signs BP 151/99   Pulse 96   Temp 97.9 F (36.6 C) (Oral)   Resp 18   Ht 6\' 1"  (1.854 m)   Wt 255 lb (115.7 kg)   SpO2 100%   BMI 33.64 kg/m   Physical Exam  Constitutional: He is oriented to person, place, and time. He appears well-developed and well-nourished.  HENT:  Head: Normocephalic and atraumatic.  Mouth/Throat: Oropharynx is clear and moist.  Eyes: Conjunctivae and EOM are normal. Pupils are equal, round, and reactive to light.  Neck: Normal range of motion.    Cardiovascular: Normal rate, regular rhythm and normal heart sounds.   Pulmonary/Chest: Effort normal and breath sounds normal. No respiratory distress. He has no wheezes.  Abdominal: Soft. Bowel sounds are normal.  Musculoskeletal: Normal range of motion.       Thoracic back: He exhibits tenderness, bony tenderness and pain.       Back:  Thoracic spine with tenderness along midline and spanning to the right paraspinal muscles; there is no deformity or step-off; full ROM maintained, however with noted pain; normal strength and sensation of both  Neurological: He is alert and oriented to person, place, and time.  Skin: Skin is warm and dry.  Psychiatric: He has a normal mood and affect.  Nursing note and vitals reviewed.  ED Treatments / Results  Labs (all labs ordered are listed, but only abnormal results are displayed) Labs Reviewed - No data to display  EKG  EKG Interpretation None       Radiology Dg Thoracic Spine 2 View  Result Date: 02/07/2016 CLINICAL DATA:  Pt is EMS. Back injury - c/o spine pain at inferior thoracic/superior lumbar onset today s/p lifting a patient onto stretcher. Denies any previous injuries. EXAM: THORACIC SPINE 2 VIEWS COMPARISON:  02/26/2015 FINDINGS: No evidence for acute fracture. There is mild scoliosis convex to the left versus positioning. IMPRESSION: No evidence for acute  abnormality. Electronically Signed   By: Nolon Nations M.D.   On: 02/07/2016 16:14   Procedures Procedures (including critical care time)  Medications Ordered in ED Medications - No data to display  DIAGNOSTIC STUDIES:  Oxygen Saturation is 100% on RA, normal by my interpretation.    COORDINATION OF CARE:  2:59 PM Discussed treatment plan with pt at bedside and pt agreed to plan.  4:53 PM Pt reports his pain has improved but is not resolved.   Initial Impression / Assessment and Plan / ED Course  I have reviewed the triage vital signs and the nursing  notes.  Pertinent labs & imaging results that were available during my care of the patient were reviewed by me and considered in my medical decision making (see chart for details).  Clinical Course    30 y.o. 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  Pain of mid-thoracic spine with some extension to right paraspinal muscles.  Pain exacerbated with movement on exam.  No focal neurologic deficits.  X-ray obtained, question of scoliosis vs positioning but noted fracture or malalignment.  Patient treated here with decadron and toradol with improvement but not resolution of pain.  Will d/c home with supportive care.  Work note given.  Will follow-up with PCP if not improving in the next few days.  Discussed plan with patient, he acknowledged understanding and agreed with plan of care.  Return precautions given for new or worsening symptoms.  I personally performed the services described in this documentation, which was scribed in my presence. The recorded information has been reviewed and is accurate.  Final Clinical Impressions(s) / ED Diagnoses   Final diagnoses:  Acute right-sided thoracic back pain    New Prescriptions New Prescriptions   CYCLOBENZAPRINE (FLEXERIL) 10 MG TABLET    Take 1 tablet (10 mg total) by mouth 2 (two) times daily as needed for muscle spasms.   HYDROCODONE-ACETAMINOPHEN (NORCO/VICODIN) 5-325 MG TABLET    Take 1 tablet by mouth every 4 (four) hours as needed.     Larene Pickett, PA-C 02/07/16 1716    Quintella Reichert, MD 02/10/16 [phone]

## 2016-02-07 NOTE — Discharge Instructions (Signed)
Take the prescribed medication as directed. Follow-up with your primary care doctor if not improving in the next few days. Return to the ED for new or worsening symptoms.

## 2016-03-15 ENCOUNTER — Other Ambulatory Visit: Payer: Self-pay | Admitting: Family Medicine

## 2016-03-15 DIAGNOSIS — R945 Abnormal results of liver function studies: Secondary | ICD-10-CM

## 2016-03-15 DIAGNOSIS — R109 Unspecified abdominal pain: Secondary | ICD-10-CM

## 2016-03-15 DIAGNOSIS — R7989 Other specified abnormal findings of blood chemistry: Secondary | ICD-10-CM

## 2016-03-23 ENCOUNTER — Ambulatory Visit
Admission: RE | Admit: 2016-03-23 | Discharge: 2016-03-23 | Disposition: A | Discharge status: Home / Self Care | Payer: BLUE CROSS/BLUE SHIELD | Source: Ambulatory Visit | Attending: Family Medicine | Admitting: Family Medicine

## 2016-03-23 DIAGNOSIS — R7989 Other specified abnormal findings of blood chemistry: Secondary | ICD-10-CM

## 2016-03-23 DIAGNOSIS — R109 Unspecified abdominal pain: Secondary | ICD-10-CM

## 2016-03-23 DIAGNOSIS — R945 Abnormal results of liver function studies: Secondary | ICD-10-CM

## 2016-06-26 ENCOUNTER — Encounter (HOSPITAL_BASED_OUTPATIENT_CLINIC_OR_DEPARTMENT_OTHER): Payer: Self-pay | Admitting: Adult Health

## 2016-06-26 ENCOUNTER — Emergency Department (HOSPITAL_BASED_OUTPATIENT_CLINIC_OR_DEPARTMENT_OTHER): Payer: Worker's Compensation

## 2016-06-26 ENCOUNTER — Emergency Department (HOSPITAL_BASED_OUTPATIENT_CLINIC_OR_DEPARTMENT_OTHER)
Admission: EM | Admit: 2016-06-26 | Discharge: 2016-06-26 | Disposition: A | Discharge status: Home / Self Care | Payer: Worker's Compensation | Attending: Emergency Medicine | Admitting: Emergency Medicine

## 2016-06-26 DIAGNOSIS — W2209XA Striking against other stationary object, initial encounter: Secondary | ICD-10-CM | POA: Insufficient documentation

## 2016-06-26 DIAGNOSIS — Y929 Unspecified place or not applicable: Secondary | ICD-10-CM | POA: Diagnosis not present

## 2016-06-26 DIAGNOSIS — M542 Cervicalgia: Secondary | ICD-10-CM

## 2016-06-26 DIAGNOSIS — Y939 Activity, unspecified: Secondary | ICD-10-CM | POA: Insufficient documentation

## 2016-06-26 DIAGNOSIS — J45909 Unspecified asthma, uncomplicated: Secondary | ICD-10-CM | POA: Insufficient documentation

## 2016-06-26 DIAGNOSIS — Y999 Unspecified external cause status: Secondary | ICD-10-CM | POA: Insufficient documentation

## 2016-06-26 DIAGNOSIS — Z79899 Other long term (current) drug therapy: Secondary | ICD-10-CM | POA: Insufficient documentation

## 2016-06-26 DIAGNOSIS — Z87891 Personal history of nicotine dependence: Secondary | ICD-10-CM | POA: Diagnosis not present

## 2016-06-26 DIAGNOSIS — S0990XA Unspecified injury of head, initial encounter: Secondary | ICD-10-CM | POA: Diagnosis present

## 2016-06-26 DIAGNOSIS — I10 Essential (primary) hypertension: Secondary | ICD-10-CM | POA: Diagnosis not present

## 2016-06-26 DIAGNOSIS — X509XXA Other and unspecified overexertion or strenuous movements or postures, initial encounter: Secondary | ICD-10-CM | POA: Diagnosis not present

## 2016-06-26 MED ORDER — ONDANSETRON 4 MG PO TBDP: 4.0000 mg | ORAL_TABLET | Freq: Three times a day (TID) | ORAL | 0 refills | Status: DC | PRN

## 2016-06-26 MED ORDER — IBUPROFEN 800 MG PO TABS
800.0000 mg | ORAL_TABLET | Freq: Once | ORAL | Status: AC
  Administered 2016-06-26: 800 mg via ORAL
  Filled 2016-06-26: qty 1

## 2016-06-26 MED ORDER — NAPROXEN 500 MG PO TABS: 500.0000 mg | ORAL_TABLET | Freq: Two times a day (BID) | ORAL | 0 refills | Status: DC

## 2016-06-26 MED ORDER — ONDANSETRON 4 MG PO TBDP
4.0000 mg | ORAL_TABLET | Freq: Once | ORAL | Status: AC
  Administered 2016-06-26: 4 mg via ORAL
  Filled 2016-06-26: qty 1

## 2016-06-26 MED ORDER — MECLIZINE HCL 25 MG PO TABS
50.0000 mg | ORAL_TABLET | Freq: Once | ORAL | Status: AC
Start: 2016-06-26 — End: 2016-06-26
  Administered 2016-06-26: 50 mg via ORAL
  Filled 2016-06-26: qty 2

## 2016-06-26 MED ORDER — MECLIZINE HCL 25 MG PO TABS: 25.0000 mg | ORAL_TABLET | Freq: Three times a day (TID) | ORAL | 0 refills | Status: DC | PRN

## 2016-06-26 NOTE — ED Notes (Signed)
Pt and SO given d/c instructions as per chart. Rx x 3. Verbalizes understanding. No questions.

## 2016-06-26 NOTE — ED Notes (Signed)
Pt ambulated with 1 person assist, c/o mild-moderate dizziness. Ambulated well in hallway

## 2016-06-26 NOTE — ED Notes (Signed)
Pt states he does not feel up to ambulating or drinking anything due to persistent nausea. States that when he moves, he gets increased nausea.

## 2016-06-26 NOTE — ED Provider Notes (Signed)
[facility] Provider Note   CSN: [id number] Arrival date & time: 06/26/16  1647  By signing my name below, I, Jaquelyn Bitter., attest that this documentation has been prepared under the direction and in the presence of No att. providers found. Electronically signed: Jaquelyn Bitter., ED Scribe. 06/26/16. 9:58 PM.    History   Chief Complaint Chief Complaint  Patient presents with  . Head Injury    HPI Glenn Burton is a 31 y.o. male who presents to the Emergency Department complaining of head injury with onset x1 hour. 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 Per partner, pt reportedly loss consciouousness when he was hit in the head. Pt reports neck pain, upper back pain, LOC. He denies any modifying factors. Pt denies vomiting.   The history is provided by the patient and a friend. No language interpreter was used.  Head Injury   The incident occurred 1 to 2 hours ago. He came to the ER via EMS. Injury mechanism: hit the top of head on air conditioning unit. He lost consciousness for a period of less than one minute. There was no blood loss. The pain is mild. The pain has been constant since the injury. Associated symptoms include memory loss. Pertinent negatives include no vomiting. He has tried nothing for the symptoms. The treatment provided no relief.    Past Medical History:  Diagnosis Date  . Allergy   . Asthma   . Hypertension   . Lipoma   . Migraine   . Pericarditis   . Pericarditis     Patient Active Problem List   Diagnosis Date Noted  . Migraine headache 01/19/2011    Past Surgical History:  Procedure Laterality Date  . TONSILLECTOMY  age 62   tubes in ears  . tubes         Home Medications    Prior to Admission medications   Medication Sig Start Date End Date Taking? Authorizing Provider  olmesartan (BENICAR) 40 MG tablet Take 40 mg by  mouth daily.   Yes [provider]  omeprazole (PRILOSEC) 20 MG capsule Take 20 mg by mouth daily.   Yes [provider]  cyclobenzaprine (FLEXERIL) 10 MG tablet Take 1 tablet (10 mg total) by mouth 2 (two) times daily as needed for muscle spasms. 02/07/16   Larene Pickett, PA-C  HYDROcodone-acetaminophen (NORCO/VICODIN) 5-325 MG tablet Take 1 tablet by mouth every 4 (four) hours as needed. 02/07/16   Larene Pickett, PA-C  ibuprofen (ADVIL,MOTRIN) 800 MG tablet Take 1 tablet (800 mg total) by mouth every 8 (eight) hours as needed for moderate pain. Patient not taking: Reported on 02/26/2015 01/29/15   Domenic Moras, PA-C  meclizine (ANTIVERT) 25 MG tablet Take 1 tablet (25 mg total) by mouth 3 (three) times daily as needed for dizziness. 06/26/16   Alfonzo Beers, MD  naproxen (NAPROSYN) 500 MG tablet Take 1 tablet (500 mg total) by mouth 2 (two) times daily. 06/26/16   Alfonzo Beers, MD  ondansetron (ZOFRAN ODT) 4 MG disintegrating tablet Take 1 tablet (4 mg total) by mouth every 8 (eight) hours as needed for nausea or vomiting. 06/26/16   Alfonzo Beers, MD    Family History Family History  Problem Relation Age of Onset  . Migraines Mother   . Hyperlipidemia Mother   . Hypertension Mother   . Diabetes Father   .  Cancer Father   . Hypertension Father   . Coronary artery disease Father   . Heart disease Father   . Hyperlipidemia Father   . Heart disease Paternal Grandmother   . Cancer Paternal Grandfather     Social History Social History  Substance Use Topics  . Smoking status: Former Smoker    Quit date: 04/26/2007  . Smokeless tobacco: Never Used  . Alcohol use Yes     Comment: Occasonal     Allergies   Amoxicillin; Penicillins; and Clindamycin/lincomycin   Review of Systems Review of Systems  Gastrointestinal: Negative for vomiting.  Musculoskeletal: Positive for back pain and neck pain.  Neurological: Positive for syncope.  Psychiatric/Behavioral:  Positive for memory loss.  All other systems reviewed and are negative.    Physical Exam Updated Vital Signs BP 136/76 (BP Location: Left Arm)   Pulse 82   Temp 98.2 F (36.8 C) (Oral)   Resp 20   Wt 248 lb (112.5 kg)   SpO2 100%   BMI 32.72 kg/m  Vitals reviewed Physical Exam Physical Examination: General appearance - alert, well appearing, and in no distress Mental status - alert, oriented to person, place, and time Eyes - pupils equal and reactive, extraocular eye movements intact Head- area of tenderness over vertex of superior scalp, no significant hematoma, no abrasion or laceration Mouth - mucous membranes moist, pharynx normal without lesions Neck - c-collar in place, tenderness to palpation in midline of cervical spine Chest - clear to auscultation, no wheezes, rales or rhonchi, symmetric air entry Heart - normal rate, regular rhythm, normal S1, S2, no murmurs, rubs, clicks or gallops Abdomen - soft, nontender, nondistended, no masses or organomegaly Neurological - alert, oriented x 3, normal speech, GCS 15, strength 5/5 in extremities x 4, sensation intact, slow gait, some amnesia for details of events Extremities - peripheral pulses normal, no pedal edema, no clubbing or cyanosis Skin - normal coloration and turgor, no rashes  ED Treatments / Results   DIAGNOSTIC STUDIES: Oxygen Saturation is 98% on RA, normal by my interpretation.   COORDINATION OF CARE: 9:58 PM-Discussed next steps with pt. Pt verbalized understanding and is agreeable with the plan.    Labs (all labs ordered are listed, but only abnormal results are displayed) Labs Reviewed - No data to display  EKG  EKG Interpretation None       Radiology Dg Thoracic Spine 2 View  Result Date: 06/26/2016 CLINICAL DATA:  Blunt trauma to mid back. EXAM: THORACIC SPINE 2 VIEWS COMPARISON:  02/07/2016 FINDINGS: Subtle curvature convex left unchanged. Vertebral body heights and disc spaces are within  normal. There is no compression fracture or subluxation. Pedicles are intact. IMPRESSION: No acute findings. Electronically Signed   By: Marin Olp M.D.   On: 06/26/2016 18:00   Ct Head Wo Contrast  Result Date: 06/26/2016 CLINICAL DATA:  Walking up stairs and bumped into and air conditioning system with blunt trauma. Nausea and blurred vision with headache. EXAM: CT HEAD WITHOUT CONTRAST CT CERVICAL SPINE WITHOUT CONTRAST TECHNIQUE: Multidetector CT imaging of the head and cervical spine was performed following the standard protocol without intravenous contrast. Multiplanar CT image reconstructions of the cervical spine were also generated. COMPARISON:  None. FINDINGS: CT HEAD FINDINGS Brain: Ventricles, cisterns and other CSF spaces are normal. No mass, mass effect, shift of midline structures or acute hemorrhage. No evidence of acute infarction. Vascular: No hyperdense vessel or unexpected calcification. Skull: Within normal. Sinuses/Orbits: Sinuses are well developed and well  aerated with minimal mucous retention cyst over the medial wall of the right maxillary sinus. Orbits are normal. Other: None. CT CERVICAL SPINE FINDINGS Alignment: Normal. Skull base and vertebrae: No acute fracture. No primary bone lesion or focal pathologic process. Soft tissues and spinal canal: No prevertebral fluid or swelling. No visible canal hematoma. Disc levels:  Within normal. Upper chest: Negative. Other: None. IMPRESSION: No acute intracranial findings. No acute cervical spine injury. Electronically Signed   By: Marin Olp M.D.   On: 06/26/2016 18:05   Ct Cervical Spine Wo Contrast  Result Date: 06/26/2016 CLINICAL DATA:  Walking up stairs and bumped into and air conditioning system with blunt trauma. Nausea and blurred vision with headache. EXAM: CT HEAD WITHOUT CONTRAST CT CERVICAL SPINE WITHOUT CONTRAST TECHNIQUE: Multidetector CT imaging of the head and cervical spine was performed following the standard  protocol without intravenous contrast. Multiplanar CT image reconstructions of the cervical spine were also generated. COMPARISON:  None. FINDINGS: CT HEAD FINDINGS Brain: Ventricles, cisterns and other CSF spaces are normal. No mass, mass effect, shift of midline structures or acute hemorrhage. No evidence of acute infarction. Vascular: No hyperdense vessel or unexpected calcification. Skull: Within normal. Sinuses/Orbits: Sinuses are well developed and well aerated with minimal mucous retention cyst over the medial wall of the right maxillary sinus. Orbits are normal. Other: None. CT CERVICAL SPINE FINDINGS Alignment: Normal. Skull base and vertebrae: No acute fracture. No primary bone lesion or focal pathologic process. Soft tissues and spinal canal: No prevertebral fluid or swelling. No visible canal hematoma. Disc levels:  Within normal. Upper chest: Negative. Other: None. IMPRESSION: No acute intracranial findings. No acute cervical spine injury. Electronically Signed   By: Marin Olp M.D.   On: 06/26/2016 18:05    Procedures Procedures (including critical care time)  Medications Ordered in ED Medications  ondansetron (ZOFRAN-ODT) disintegrating tablet 4 mg (4 mg Oral Given 06/26/16 1705)  ibuprofen (ADVIL,MOTRIN) tablet 800 mg (800 mg Oral Given 06/26/16 1822)  meclizine (ANTIVERT) tablet 50 mg (50 mg Oral Given 06/26/16 1822)     Initial Impression / Assessment and Plan / ED Course  I have reviewed the triage vital signs and the nursing notes.  Pertinent labs & imaging results that were available during my care of the patient were reviewed by me and considered in my medical decision making (see chart for details).     Pt presenting after head injury- he stepped up and got hit in top of head by window air conditioning unit. He did have loss of consciousness, nausea, some amnesia for event.  Also c/o neck pain, upper back pain.  Pt had head CT, cervical spine CT and thoracic xrays.  These  were all reassuring.  He was able to tolerate po fluids in the ED and ambulate- he felt some dizziness and was treated with meclizine.  Pt was referred to neurology for followup due to concussion symptoms.  Discharged with strict return precautions.  Pt agreeable with plan.  Final Clinical Impressions(s) / ED Diagnoses   Final diagnoses:  Injury of head, initial encounter  Neck pain    New Prescriptions Discharge Medication List as of 06/26/2016  7:33 PM    START taking these medications   Details  meclizine (ANTIVERT) 25 MG tablet Take 1 tablet (25 mg total) by mouth 3 (three) times daily as needed for dizziness., Starting Sat 06/26/2016, Print    naproxen (NAPROSYN) 500 MG tablet Take 1 tablet (500 mg total) by mouth 2 (  two) times daily., Starting Sat 06/26/2016, Print       I personally performed the services described in this documentation, which was scribed in my presence. The recorded information has been reviewed and is accurate.      Alfonzo Beers, MD 06/27/16 1531

## 2016-06-26 NOTE — ED Triage Notes (Signed)
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 He reports he does not remember what happened next but partner states it brought him to his knees and he was out of it. Endorses neck pain, headache and nausea. He is alert and slow to respond.

## 2016-06-26 NOTE — Discharge Instructions (Signed)
Return to the ED with any concerns including vomiting, seizure activity, fainting, weakness of arms or legs, not able to walk, decreased level of alertness/lethargy, or any other alarming symptoms

## 2018-01-04 ENCOUNTER — Emergency Department (HOSPITAL_COMMUNITY): Payer: Managed Care, Other (non HMO)

## 2018-01-04 ENCOUNTER — Emergency Department (HOSPITAL_COMMUNITY)
Admission: EM | Admit: 2018-01-04 | Discharge: 2018-01-05 | Disposition: A | Discharge status: Home / Self Care | Payer: Managed Care, Other (non HMO) | Attending: Emergency Medicine | Admitting: Emergency Medicine

## 2018-01-04 ENCOUNTER — Encounter (HOSPITAL_COMMUNITY): Payer: Self-pay | Admitting: Emergency Medicine

## 2018-01-04 ENCOUNTER — Other Ambulatory Visit: Payer: Self-pay

## 2018-01-04 DIAGNOSIS — Y999 Unspecified external cause status: Secondary | ICD-10-CM | POA: Insufficient documentation

## 2018-01-04 DIAGNOSIS — S99921A Unspecified injury of right foot, initial encounter: Secondary | ICD-10-CM | POA: Diagnosis present

## 2018-01-04 DIAGNOSIS — Y9301 Activity, walking, marching and hiking: Secondary | ICD-10-CM | POA: Diagnosis not present

## 2018-01-04 DIAGNOSIS — I10 Essential (primary) hypertension: Secondary | ICD-10-CM | POA: Diagnosis not present

## 2018-01-04 DIAGNOSIS — Z79899 Other long term (current) drug therapy: Secondary | ICD-10-CM | POA: Diagnosis not present

## 2018-01-04 DIAGNOSIS — W1841XA Slipping, tripping and stumbling without falling due to stepping on object, initial encounter: Secondary | ICD-10-CM | POA: Diagnosis not present

## 2018-01-04 DIAGNOSIS — Z87891 Personal history of nicotine dependence: Secondary | ICD-10-CM | POA: Diagnosis not present

## 2018-01-04 DIAGNOSIS — S93601A Unspecified sprain of right foot, initial encounter: Secondary | ICD-10-CM | POA: Insufficient documentation

## 2018-01-04 DIAGNOSIS — Y92009 Unspecified place in unspecified non-institutional (private) residence as the place of occurrence of the external cause: Secondary | ICD-10-CM | POA: Diagnosis not present

## 2018-01-04 DIAGNOSIS — J45909 Unspecified asthma, uncomplicated: Secondary | ICD-10-CM | POA: Diagnosis not present

## 2018-01-04 NOTE — ED Provider Notes (Signed)
[facility] DEPT Provider Note   CSN: [id number] Arrival date & time: 01/04/18  2230     History   Chief Complaint Chief Complaint  Patient presents with  . Foot Injury    HPI Glenn Burton is a 32 y.o. male.   32 year old male presents to the emergency department for evaluation of right foot pain.  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  This occurred at 9 AM this morning.  He is continued to take 800 mg ibuprofen, elevate and ice his foot.  Notes worsening pain throughout the course of the day with inability to bear weight on his right leg secondary to pain in his foot.  Notes some subjective paresthesias in his toes.  No numbness.  Denies any prior injury to the right foot in the past.     Past Medical History:  Diagnosis Date  . Allergy   . Asthma   . Hypertension   . Lipoma   . Migraine   . Pericarditis   . Pericarditis     Patient Active Problem List   Diagnosis Date Noted  . Migraine headache 01/19/2011    Past Surgical History:  Procedure Laterality Date  . TONSILLECTOMY  age 73   tubes in ears  . tubes          Home Medications    Prior to Admission medications   Medication Sig Start Date End Date Taking? Authorizing Provider  cyclobenzaprine (FLEXERIL) 10 MG tablet Take 1 tablet (10 mg total) by mouth 2 (two) times daily as needed for muscle spasms. 02/07/16   Larene Pickett, PA-C  HYDROcodone-acetaminophen (NORCO/VICODIN) 5-325 MG tablet Take 1 tablet by mouth every 4 (four) hours as needed. 02/07/16   Larene Pickett, PA-C  ibuprofen (ADVIL,MOTRIN) 800 MG tablet Take 1 tablet (800 mg total) by mouth every 8 (eight) hours as needed for moderate pain. Patient not taking: Reported on 02/26/2015 01/29/15   Domenic Moras, PA-C  meclizine (ANTIVERT) 25 MG tablet Take 1 tablet (25 mg total) by mouth 3 (three) times daily as needed for dizziness. 06/26/16    Mabe, Forbes Cellar, MD  naproxen (NAPROSYN) 500 MG tablet Take 1 tablet (500 mg total) by mouth 2 (two) times daily. 06/26/16   Mabe, Forbes Cellar, MD  olmesartan (BENICAR) 40 MG tablet Take 40 mg by mouth daily.    [provider]  omeprazole (PRILOSEC) 20 MG capsule Take 20 mg by mouth daily.    [provider]  ondansetron (ZOFRAN ODT) 4 MG disintegrating tablet Take 1 tablet (4 mg total) by mouth every 8 (eight) hours as needed for nausea or vomiting. 06/26/16   Mabe, Forbes Cellar, MD    Family History Family History  Problem Relation Age of Onset  . Migraines Mother   . Hyperlipidemia Mother   . Hypertension Mother   . Diabetes Father   . Cancer Father   . Hypertension Father   . Coronary artery disease Father   . Heart disease Father   . Hyperlipidemia Father   . Heart disease Paternal Grandmother   . Cancer Paternal Grandfather     Social History Social History   Tobacco Use  . Smoking status: Former Smoker    Last attempt to quit: 04/26/2007    Years since quitting: 10.7  . Smokeless tobacco: Never Used  Substance Use Topics  . Alcohol use: Yes  Comment: Occasonal  . Drug use: No     Allergies   Amoxicillin; Penicillins; and Clindamycin/lincomycin   Review of Systems Review of Systems Ten systems reviewed and are negative for acute change, except as noted in the HPI.    Physical Exam Updated Vital Signs BP (!) 141/89 (BP Location: Right Arm)   Pulse 83   Temp 97.7 F (36.5 C) (Oral)   Resp 18   Ht 6\' 1"  (1.854 m)   Wt 113.4 kg   SpO2 98%   BMI 32.98 kg/m   Physical Exam  Constitutional: He is oriented to person, place, and time. He appears well-developed and well-nourished. No distress.  Nontoxic appearing and in NAD  HENT:  Head: Normocephalic and atraumatic.  Eyes: Conjunctivae and EOM are normal. No scleral icterus.  Neck: Normal range of motion.  Cardiovascular: Normal rate, regular rhythm and intact distal pulses.  DP pulse 2+ in  the RLE  Pulmonary/Chest: Effort normal. No respiratory distress.  Respirations even and unlabored  Musculoskeletal: Normal range of motion. He exhibits tenderness.  TTP along the dorsal and medial aspect of the R foot. Mild swelling. No bony deformity or crepitus.  Neurological: He is alert and oriented to person, place, and time. He exhibits normal muscle tone. Coordination normal.  Sensation to light touch intact. Able to wiggle all toes.  Skin: Skin is warm and dry. No rash noted. He is not diaphoretic. No erythema. No pallor.  Psychiatric: He has a normal mood and affect. His behavior is normal.  Nursing note and vitals reviewed.    ED Treatments / Results  Labs (all labs ordered are listed, but only abnormal results are displayed) Labs Reviewed - No data to display  EKG None  Radiology Dg Foot Complete Right  Result Date: 01/04/2018 CLINICAL DATA:  Initial evaluation for acute right foot pain status post fall. EXAM: RIGHT FOOT COMPLETE - 3+ VIEW COMPARISON:  None. FINDINGS: There is no evidence of fracture or dislocation. There is no evidence of arthropathy or other focal bone abnormality. Soft tissues are unremarkable. IMPRESSION: No acute osseous abnormality about the right foot. Electronically Signed   By: Jeannine Boga M.D.   On: 01/04/2018 23:07    Procedures Procedures (including critical care time)  Medications Ordered in ED Medications - No data to display   Initial Impression / Assessment and Plan / ED Course  I have reviewed the triage vital signs and the nursing notes.  Pertinent labs & imaging results that were available during my care of the patient were reviewed by me and considered in my medical decision making (see chart for details).     Patient presents to the emergency department for evaluation of R foot pain. Patient neurovascularly intact on exam. Imaging negative for fracture, dislocation, bony deformity. No swelling, erythema, heat to  touch to the affected area; no concern for septic joint. Compartments in the affected extremity are soft. Plan for supportive management including RICE and NSAIDs; primary care follow up as needed. Return precautions discussed and provided. Patient discharged in stable condition with no unaddressed concerns.   Final Clinical Impressions(s) / ED Diagnoses   Final diagnoses:  Sprain of right foot, initial encounter    ED Discharge Orders    None       Antonietta Breach, PA-C 01/04/18 2341    Molpus, Jenny Reichmann, MD 01/05/18 [phone]

## 2018-01-04 NOTE — ED Triage Notes (Signed)
Pt presents with right foot pain after tripping over a mega block at his home. Patient states his ankle rolled and he is unable to bear weight on his foot. Redness and swelling noted.

## 2018-10-25 ENCOUNTER — Other Ambulatory Visit: Payer: Self-pay

## 2018-10-25 DIAGNOSIS — Z20822 Contact with and (suspected) exposure to covid-19: Secondary | ICD-10-CM

## 2018-10-26 LAB — NOVEL CORONAVIRUS, NAA: SARS-CoV-2, NAA: NOT DETECTED

## 2018-11-01 ENCOUNTER — Other Ambulatory Visit: Payer: Self-pay

## 2018-11-01 DIAGNOSIS — Z20822 Contact with and (suspected) exposure to covid-19: Secondary | ICD-10-CM

## 2018-11-03 LAB — NOVEL CORONAVIRUS, NAA: SARS-CoV-2, NAA: NOT DETECTED

## 2018-12-11 ENCOUNTER — Other Ambulatory Visit: Payer: Self-pay

## 2018-12-11 DIAGNOSIS — Z20822 Contact with and (suspected) exposure to covid-19: Secondary | ICD-10-CM

## 2018-12-12 LAB — NOVEL CORONAVIRUS, NAA: SARS-CoV-2, NAA: NOT DETECTED

## 2018-12-15 ENCOUNTER — Other Ambulatory Visit: Payer: Self-pay

## 2018-12-15 DIAGNOSIS — Z20822 Contact with and (suspected) exposure to covid-19: Secondary | ICD-10-CM

## 2018-12-16 LAB — NOVEL CORONAVIRUS, NAA: SARS-CoV-2, NAA: NOT DETECTED

## 2019-02-21 ENCOUNTER — Other Ambulatory Visit: Payer: 59

## 2019-02-21 ENCOUNTER — Ambulatory Visit: Payer: Managed Care, Other (non HMO) | Attending: Internal Medicine

## 2019-02-21 DIAGNOSIS — Z20822 Contact with and (suspected) exposure to covid-19: Secondary | ICD-10-CM

## 2019-02-23 LAB — NOVEL CORONAVIRUS, NAA: SARS-CoV-2, NAA: NOT DETECTED

## 2019-04-27 ENCOUNTER — Ambulatory Visit: Payer: 59 | Attending: Internal Medicine

## 2019-04-27 DIAGNOSIS — Z20822 Contact with and (suspected) exposure to covid-19: Secondary | ICD-10-CM

## 2019-04-28 LAB — NOVEL CORONAVIRUS, NAA: SARS-CoV-2, NAA: NOT DETECTED

## 2019-10-04 ENCOUNTER — Ambulatory Visit
Admission: EM | Admit: 2019-10-04 | Discharge: 2019-10-04 | Disposition: A | Discharge status: Home / Self Care | Payer: Managed Care, Other (non HMO) | Attending: Family Medicine | Admitting: Family Medicine

## 2019-10-04 ENCOUNTER — Other Ambulatory Visit: Payer: Self-pay

## 2019-10-04 DIAGNOSIS — H6691 Otitis media, unspecified, right ear: Secondary | ICD-10-CM | POA: Diagnosis not present

## 2019-10-04 DIAGNOSIS — R05 Cough: Secondary | ICD-10-CM | POA: Diagnosis not present

## 2019-10-04 DIAGNOSIS — Z1152 Encounter for screening for COVID-19: Secondary | ICD-10-CM | POA: Diagnosis not present

## 2019-10-04 DIAGNOSIS — R059 Cough, unspecified: Secondary | ICD-10-CM

## 2019-10-04 MED ORDER — CEFDINIR 300 MG PO CAPS: 300.0000 mg | ORAL_CAPSULE | Freq: Two times a day (BID) | ORAL | 0 refills | Status: AC

## 2019-10-04 NOTE — ED Triage Notes (Signed)
Pt presents with complaints of sore throat and cough x 1 week. 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 Patient also reports congestion and ear pressure. Reports using otc medication with minimal relief.

## 2019-10-04 NOTE — ED Provider Notes (Signed)
[place]   [id number] 10/04/19 Arrival Time: 2778  CC: EAR PAIN  SUBJECTIVE: History from: patient.  Glenn Burton is a 34 y.o. male who presents with of right ear pain for the past 1 week. Denies a precipitating event, such as swimming or wearing ear plugs. Patient states the pain is constant and achy in character. Patient has not taken OTC medications for this. Symptoms are made worse with lying down. Denies similar symptoms in the past. Denies fever, chills, fatigue, sinus pain, rhinorrhea, ear discharge, SOB, wheezing, chest pain, nausea, changes in bowel or bladder habits.    ROS: As per HPI.  All other pertinent ROS negative.     Past Medical History:  Diagnosis Date  . Allergy   . Asthma   . Hypertension   . Lipoma   . Migraine   . Pericarditis   . Pericarditis    Past Surgical History:  Procedure Laterality Date  . TONSILLECTOMY  age 79   tubes in ears  . tubes     Allergies  Allergen Reactions  . Amoxicillin Other (See Comments)    Childhood Reaction.  Marland Kitchen Penicillins     Precaution since reaction to amoxicillin  . Clindamycin/Lincomycin Rash   No current facility-administered medications on file prior to encounter.   Current Outpatient Medications on File Prior to Encounter  Medication Sig Dispense Refill  . cyclobenzaprine (FLEXERIL) 10 MG tablet Take 1 tablet (10 mg total) by mouth 2 (two) times daily as needed for muscle spasms. 20 tablet 0  . HYDROcodone-acetaminophen (NORCO/VICODIN) 5-325 MG tablet Take 1 tablet by mouth every 4 (four) hours as needed. 10 tablet 0  . ibuprofen (ADVIL,MOTRIN) 800 MG tablet Take 1 tablet (800 mg total) by mouth every 8 (eight) hours as needed for moderate pain. (Patient not taking: Reported on 02/26/2015) 21 tablet 0  . meclizine (ANTIVERT) 25 MG tablet Take 1 tablet (25 mg total) by mouth 3 (three) times daily as needed for dizziness. 30 tablet 0  . naproxen (NAPROSYN) 500 MG tablet Take 1 tablet (500 mg total)  by mouth 2 (two) times daily. 30 tablet 0  . olmesartan (BENICAR) 40 MG tablet Take 40 mg by mouth daily.    Marland Kitchen omeprazole (PRILOSEC) 20 MG capsule Take 20 mg by mouth daily.    . ondansetron (ZOFRAN ODT) 4 MG disintegrating tablet Take 1 tablet (4 mg total) by mouth every 8 (eight) hours as needed for nausea or vomiting. 20 tablet 0   Social History   Socioeconomic History  . Marital status: Married    Spouse name: Not on file  . Number of children: Not on file  . Years of education: Not on file  . Highest education level: Not on file  Occupational History  . Not on file  Tobacco Use  . Smoking status: Former Smoker    Quit date: 04/26/2007    Years since quitting: 12.4  . Smokeless tobacco: Never Used  Substance and Sexual Activity  . Alcohol use: Yes    Comment: Occasonal  . Drug use: No  . Sexual activity: Not on file  Other Topics Concern  . Not on file  Social History Narrative  . Not on file   Social Determinants of Health   Financial Resource Strain:   . Difficulty of Paying Living Expenses: Not on file  Food Insecurity:   . Worried About Charity fundraiser in the Last Year: Not on file  . Ran Out of Food in  the Last Year: Not on file  Transportation Needs:   . Lack of Transportation (Medical): Not on file  . Lack of Transportation (Non-Medical): Not on file  Physical Activity:   . Days of Exercise per Week: Not on file  . Minutes of Exercise per Session: Not on file  Stress:   . Feeling of Stress : Not on file  Social Connections:   . Frequency of Communication with Friends and Family: Not on file  . Frequency of Social Gatherings with Friends and Family: Not on file  . Attends Religious Services: Not on file  . Active Member of Clubs or Organizations: Not on file  . Attends Archivist Meetings: Not on file  . Marital Status: Not on file  Intimate Partner Violence:   . Fear of Current or Ex-Partner: Not on file  . Emotionally Abused: Not on file   . Physically Abused: Not on file  . Sexually Abused: Not on file   Family History  Problem Relation Age of Onset  . Migraines Mother   . Hyperlipidemia Mother   . Hypertension Mother   . Diabetes Father   . Cancer Father   . Hypertension Father   . Coronary artery disease Father   . Heart disease Father   . Hyperlipidemia Father   . Heart disease Paternal Grandmother   . Cancer Paternal Grandfather     OBJECTIVE:  Vitals:   10/04/19 1437  BP: (!) 137/92  Pulse: 77  Resp: 18  Temp: 98.6 F (37 C)  SpO2: 97%     General appearance: alert; appears fatigued HEENT: Ears: EACs clear, L TM erythmatous, R TM erythematous, bulging, with effusion; Eyes: PERRL, EOMI grossly; Sinuses nontender to palpation; Nose: clear rhinorrhea; Throat: oropharynx mildly erythematous, tonsils not present, uvula midline Neck: supple without LAD Lungs: unlabored respirations, symmetrical air entry; cough: absent; no respiratory distress Heart: regular rate and rhythm.  Radial pulses 2+ symmetrical bilaterally Skin: warm and dry Psychological: alert and cooperative; normal mood and affect  Imaging: No results found.   ASSESSMENT & PLAN:  1. Right otitis media, unspecified otitis media type   2. Cough   3. Encounter for screening for COVID-19     Meds ordered this encounter  Medications  . cefdinir (OMNICEF) 300 MG capsule    Sig: Take 1 capsule (300 mg total) by mouth 2 (two) times daily for 10 days.    Dispense:  20 capsule    Refill:  0    Order Specific Question:   Supervising Provider    Answer:   Chase Picket [[id number]]    Rest and drink plenty of fluids Prescribed Cefdinir BID x 7 days Take medications as directed and to completion Continue to use OTC ibuprofen and/ or tylenol as needed for pain control Follow up with PCP if symptoms persists Return here or go to the ER if you have any new or worsening symptoms   Covid swab obtained in office today.  Patient instructed  to quarantine until results are back and negative.  If results are negative, patient may resume daily schedule as tolerated once they are fever free for 24 hours without the use of antipyretic medications.  If results are positive, patient instructed to quarantine 10 days from today.  Patient instructed to follow-up with primary care with this office as needed.  Patient instructed to follow-up in the ER for trouble swallowing, trouble breathing, other concerning symptoms.   Reviewed expectations re: course of current medical  issues. Questions answered. Outlined signs and symptoms indicating need for more acute intervention. Patient verbalized understanding. After Visit Summary given.         Faustino Congress, NP 10/04/19 1459

## 2019-10-04 NOTE — Discharge Instructions (Addendum)
Your COVID test is pending.  You should self quarantine until the test result is back.    Take Tylenol as needed for fever or discomfort.  Rest and keep yourself hydrated.    Go to the emergency department if you develop acute worsening symptoms.     

## 2019-10-06 LAB — NOVEL CORONAVIRUS, NAA: SARS-CoV-2, NAA: NOT DETECTED

## 2019-10-06 LAB — SARS-COV-2, NAA 2 DAY TAT

## 2019-10-19 ENCOUNTER — Other Ambulatory Visit: Payer: Managed Care, Other (non HMO)

## 2019-10-19 ENCOUNTER — Other Ambulatory Visit: Payer: Self-pay

## 2019-10-19 DIAGNOSIS — Z20822 Contact with and (suspected) exposure to covid-19: Secondary | ICD-10-CM

## 2019-10-22 LAB — NOVEL CORONAVIRUS, NAA: SARS-CoV-2, NAA: NOT DETECTED

## 2019-11-15 ENCOUNTER — Ambulatory Visit: Payer: Managed Care, Other (non HMO) | Attending: Internal Medicine

## 2019-11-15 DIAGNOSIS — Z23 Encounter for immunization: Secondary | ICD-10-CM

## 2019-11-15 NOTE — Progress Notes (Signed)
   Covid-19 Vaccination Clinic  Name:  Glenn Burton    MRN: [id number] DOB: 1985/03/17  11/15/2019  Mr. Glenn Burton was observed post Covid-19 immunization for 15 minutes without incident. He was provided with Vaccine Information Sheet and instruction to access the V-Safe system.   Mr. Glenn Burton was instructed to call 911 with any severe reactions post vaccine: Marland Kitchen Difficulty breathing  . Swelling of face and throat  . A fast heartbeat  . A bad rash all over body  . Dizziness and weakness

## 2020-02-22 ENCOUNTER — Other Ambulatory Visit: Payer: Self-pay | Admitting: Sleep Medicine

## 2020-02-22 ENCOUNTER — Other Ambulatory Visit: Payer: Managed Care, Other (non HMO)

## 2020-02-22 DIAGNOSIS — Z20822 Contact with and (suspected) exposure to covid-19: Secondary | ICD-10-CM

## 2020-02-26 LAB — NOVEL CORONAVIRUS, NAA: SARS-CoV-2, NAA: NOT DETECTED

## 2020-04-18 DIAGNOSIS — M546 Pain in thoracic spine: Secondary | ICD-10-CM | POA: Insufficient documentation

## 2020-04-18 HISTORY — DX: Pain in thoracic spine: M54.6

## 2020-08-29 ENCOUNTER — Encounter: Payer: Self-pay | Admitting: Emergency Medicine

## 2020-08-29 ENCOUNTER — Ambulatory Visit
Admission: EM | Admit: 2020-08-29 | Discharge: 2020-08-29 | Disposition: A | Discharge status: Home / Self Care | Payer: Managed Care, Other (non HMO)

## 2020-08-29 ENCOUNTER — Ambulatory Visit (HOSPITAL_BASED_OUTPATIENT_CLINIC_OR_DEPARTMENT_OTHER)
Admission: RE | Admit: 2020-08-29 | Discharge: 2020-08-29 | Disposition: A | Discharge status: Home / Self Care | Payer: Managed Care, Other (non HMO) | Source: Ambulatory Visit | Attending: Emergency Medicine | Admitting: Emergency Medicine

## 2020-08-29 ENCOUNTER — Telehealth: Payer: Self-pay | Admitting: Emergency Medicine

## 2020-08-29 ENCOUNTER — Other Ambulatory Visit: Payer: Self-pay

## 2020-08-29 DIAGNOSIS — R059 Cough, unspecified: Secondary | ICD-10-CM

## 2020-08-29 DIAGNOSIS — J019 Acute sinusitis, unspecified: Secondary | ICD-10-CM

## 2020-08-29 HISTORY — DX: Cough, unspecified: R05.9

## 2020-08-29 MED ORDER — PREDNISONE 50 MG PO TABS: 50.0000 mg | ORAL_TABLET | Freq: Every day | ORAL | 0 refills | Status: AC

## 2020-08-29 MED ORDER — BENZONATATE 200 MG PO CAPS: 200.0000 mg | ORAL_CAPSULE | Freq: Three times a day (TID) | ORAL | 0 refills | Status: AC | PRN

## 2020-08-29 MED ORDER — ALBUTEROL SULFATE HFA 108 (90 BASE) MCG/ACT IN AERS: 1.0000 | INHALATION_SPRAY | Freq: Four times a day (QID) | RESPIRATORY_TRACT | 0 refills | Status: AC | PRN

## 2020-08-29 MED ORDER — DOXYCYCLINE HYCLATE 100 MG PO CAPS: 100.0000 mg | ORAL_CAPSULE | Freq: Two times a day (BID) | ORAL | 0 refills | Status: AC

## 2020-08-29 NOTE — ED Triage Notes (Signed)
For about 2 weeks has been have sinus congestion and now feels like it has moved into chest.  Feels like he has some wheezing. Coughing with grayish colored mucous. Slight fever with sever headache.  99.3. decongest and allergy medication OTC.

## 2020-08-29 NOTE — ED Provider Notes (Signed)
[facility]    CSN: [id number] Arrival date & time: 08/29/20  1027      History   Chief Complaint Chief Complaint  Patient presents with   chest congestion    HPI Glenn Burton is a 35 y.o. male history of asthma, hypertension, migraines, presenting today for evaluation of cough and congestion.  Reports that he has had congestion and sinus pressure over the past 2 weeks.  Of recently he has had worsening cough and congestion in his chest with associated inspiratory wheezing.  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  Using his allergy medicines without relief of symptoms.  Reports he is increased discomfort in his chest and back with associated burning sensation in chest.  Denies fevers.  HPI  Past Medical History:  Diagnosis Date   Allergy    Asthma    Hypertension    Lipoma    Migraine    Pericarditis    Pericarditis     Patient Active Problem List   Diagnosis Date Noted   Cough 08/29/2020   Migraine headache 01/19/2011    Past Surgical History:  Procedure Laterality Date   TONSILLECTOMY  age 73   tubes in ears   tubes         Home Medications    Prior to Admission medications   Medication Sig Start Date End Date Taking? Authorizing Provider  albuterol (VENTOLIN HFA) 108 (90 Base) MCG/ACT inhaler Inhale 1-2 puffs into the lungs every 6 (six) hours as needed for wheezing or shortness of breath. 08/29/20  Yes Tylerjames Hoglund C, PA-C  benzonatate (TESSALON) 200 MG capsule Take 1 capsule (200 mg total) by mouth 3 (three) times daily as needed for up to 7 days for cough. 08/29/20 09/05/20 Yes Mekisha Bittel C, PA-C  doxycycline (VIBRAMYCIN) 100 MG capsule Take 1 capsule (100 mg total) by mouth 2 (two) times daily for 7 days. 08/29/20 09/05/20 Yes Joshalyn Ancheta C, PA-C  predniSONE (DELTASONE) 50 MG tablet Take 1 tablet (50 mg total) by mouth daily with breakfast for 5 days. 08/29/20 09/03/20 Yes Thurma Priego C, PA-C  ADDERALL XR 10 MG 24 hr capsule  Take 10 mg by mouth every morning. 08/04/20   [provider]  cyclobenzaprine (FLEXERIL) 10 MG tablet Take 1 tablet (10 mg total) by mouth 2 (two) times daily as needed for muscle spasms. 02/07/16   Larene Pickett, PA-C  HYDROcodone-acetaminophen (NORCO/VICODIN) 5-325 MG tablet Take 1 tablet by mouth every 4 (four) hours as needed. 02/07/16   Larene Pickett, PA-C  meclizine (ANTIVERT) 25 MG tablet Take 1 tablet (25 mg total) by mouth 3 (three) times daily as needed for dizziness. 06/26/16   Mabe, Forbes Cellar, MD  naproxen (NAPROSYN) 500 MG tablet Take 1 tablet (500 mg total) by mouth 2 (two) times daily. 06/26/16   Mabe, Forbes Cellar, MD  olmesartan (BENICAR) 40 MG tablet Take 40 mg by mouth daily.    [provider]  omeprazole (PRILOSEC) 20 MG capsule Take 20 mg by mouth daily.    [provider]  ondansetron (ZOFRAN ODT) 4 MG disintegrating tablet Take 1 tablet (4 mg total) by mouth every 8 (eight) hours as needed for nausea or vomiting. 06/26/16   Mabe, Forbes Cellar, MD    Family History Family History  Problem Relation Age of Onset   Migraines Mother    Hyperlipidemia Mother    Hypertension Mother    Diabetes Father    Cancer Father    Hypertension  Father    Coronary artery disease Father    Heart disease Father    Hyperlipidemia Father    Heart disease Paternal Grandmother    Cancer Paternal Grandfather     Social History Social History   Tobacco Use   Smoking status: Former    Types: Cigarettes    Quit date: 04/26/2007    Years since quitting: 13.3   Smokeless tobacco: Never  Substance Use Topics   Alcohol use: Yes    Comment: Occasonal   Drug use: No     Allergies   Amoxicillin, Penicillins, and Clindamycin/lincomycin   Review of Systems Review of Systems  Constitutional:  Negative for activity change, appetite change, chills, fatigue and fever.  HENT:  Positive for congestion, rhinorrhea and sinus pressure. Negative for ear pain, sore throat and  trouble swallowing.   Eyes:  Negative for discharge and redness.  Respiratory:  Positive for cough, chest tightness and wheezing. Negative for shortness of breath.   Cardiovascular:  Negative for chest pain.  Gastrointestinal:  Negative for abdominal pain, diarrhea, nausea and vomiting.  Musculoskeletal:  Negative for myalgias.  Skin:  Negative for rash.  Neurological:  Negative for dizziness, light-headedness and headaches.    Physical Exam Triage Vital Signs ED Triage Vitals  Enc Vitals Group     BP 08/29/20 1130 (!) 150/84     Pulse Rate 08/29/20 1130 92     Resp 08/29/20 1130 18     Temp 08/29/20 1130 97.7 F (36.5 C)     Temp Source 08/29/20 1130 Oral     SpO2 08/29/20 1130 96 %     Weight 08/29/20 1131 250 lb (113.4 kg)     Height --      Head Circumference --      Peak Flow --      Pain Score 08/29/20 1131 3     Pain Loc --      Pain Edu? --      Excl. in [place]? --    No data found.  Updated Vital Signs BP (!) 150/84 (BP Location: Right Arm)   Pulse 92   Temp 97.7 F (36.5 C) (Oral)   Resp 18   Wt 250 lb (113.4 kg)   SpO2 96%   BMI 32.98 kg/m   Visual Acuity Right Eye Distance:   Left Eye Distance:   Bilateral Distance:    Right Eye Near:   Left Eye Near:    Bilateral Near:     Physical Exam Vitals and nursing note reviewed.  Constitutional:      Appearance: He is well-developed.     Comments: No acute distress  HENT:     Head: Normocephalic and atraumatic.     Ears:     Comments: Bilateral ears without tenderness to palpation of external auricle, tragus and mastoid, EAC's without erythema or swelling, TM's with good bony landmarks and cone of light. Non erythematous.      Nose: Nose normal.     Mouth/Throat:     Comments: Oral mucosa pink and moist, no tonsillar enlargement or exudate. Posterior pharynx patent and nonerythematous, no uvula deviation or swelling. Normal phonation.  Eyes:     Conjunctiva/sclera: Conjunctivae normal.   Cardiovascular:     Rate and Rhythm: Normal rate.  Pulmonary:     Effort: Pulmonary effort is normal. No respiratory distress.     Comments: Breathing comfortably at rest, CTABL, no wheezing, rales or other adventitious sounds auscultated  Abdominal:  General: There is no distension.  Musculoskeletal:        General: Normal range of motion.     Cervical back: Neck supple.  Skin:    General: Skin is warm and dry.  Neurological:     Mental Status: He is alert and oriented to person, place, and time.     UC Treatments / Results  Labs (all labs ordered are listed, but only abnormal results are displayed) Labs Reviewed  COVID-19, FLU A+B NAA    EKG   Radiology No results found.  Procedures Procedures (including critical care time)  Medications Ordered in UC Medications - No data to display  Initial Impression / Assessment and Plan / UC Course  I have reviewed the triage vital signs and the nursing notes.  Pertinent labs & imaging results that were available during my care of the patient were reviewed by me and considered in my medical decision making (see chart for details).     URI symptoms x2 weeks-covering for sinusitis with doxycycline, treating also for bronchitis with prednisone and albuterol continue symptomatic and supportive care of cough and congestion.  Placed order for chest x-ray given report of burning sensation in chest to rule out pneumonia or other abnormalities contributing to symptoms.  We will call with results and change therapy as needed.  Discussed strict return precautions. Patient verbalized understanding and is agreeable with plan.  Final Clinical Impressions(s) / UC Diagnoses   Final diagnoses:  Cough     Discharge Instructions      Doxycycline twice daily x1 week Prednisone 50 mg daily x5 days-take with food and earlier in the day if possible Albuterol inhaler 1 to 2 puffs every 4-6 hours as needed for shortness of breath, chest  tightness and wheezing Mucinex over-the-counter Tessalon for cough Continue allergy medicines Follow-up if not improving or worsening     ED Prescriptions     Medication Sig Dispense Auth. Provider   doxycycline (VIBRAMYCIN) 100 MG capsule Take 1 capsule (100 mg total) by mouth 2 (two) times daily for 7 days. 14 capsule Pheonix Wisby C, PA-C   benzonatate (TESSALON) 200 MG capsule Take 1 capsule (200 mg total) by mouth 3 (three) times daily as needed for up to 7 days for cough. 28 capsule Garnell Phenix C, PA-C   predniSONE (DELTASONE) 50 MG tablet Take 1 tablet (50 mg total) by mouth daily with breakfast for 5 days. 5 tablet Yunuen Mordan C, PA-C   albuterol (VENTOLIN HFA) 108 (90 Base) MCG/ACT inhaler Inhale 1-2 puffs into the lungs every 6 (six) hours as needed for wheezing or shortness of breath. 1 each Maliya Marich, Elesa Hacker, PA-C      PDMP not reviewed this encounter.   Janith Lima, [place] 08/29/20 1220

## 2020-08-29 NOTE — Telephone Encounter (Signed)
Changing location to [facility] for chest xray

## 2020-08-29 NOTE — Discharge Instructions (Addendum)
Doxycycline twice daily x1 week Prednisone 50 mg daily x5 days-take with food and earlier in the day if possible Albuterol inhaler 1 to 2 puffs every 4-6 hours as needed for shortness of breath, chest tightness and wheezing Mucinex over-the-counter Tessalon for cough Continue allergy medicines Follow-up if not improving or worsening

## 2020-08-31 LAB — COVID-19, FLU A+B NAA
Influenza A, NAA: NOT DETECTED
Influenza B, NAA: NOT DETECTED
SARS-CoV-2, NAA: NOT DETECTED

## 2020-09-30 NOTE — Progress Notes (Signed)
Date:  10/01/2020   ID:  Glenn Burton, DOB [birth year], MRN [id number]  PCP:  Fanny Bien, MD  Cardiologist:  Rex Kras, DO, [facility]  (established care 10/01/2020)  REASON FOR CONSULT: Tachycardia  REQUESTING PHYSICIAN:  Fanny Bien, MD [address] [place],  [place] [address]  Chief Complaint  Patient presents with   Chest pain   Tachycardia   New Patient (Initial Visit)    HPI  Glenn Burton is a 35 y.o. male who presents to the office with a chief complaint of " tachycardia, chest pain." Patient's past medical history and cardiovascular risk factors include: Anxiety, ADHD, hypertension, hyperlipidemia, family history of premature CAD, history of pericarditis, former smoker.  He is referred to the office at the request of Fanny Bien, MD for evaluation of tachycardia.  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  Over the last several months patient states that she has been experiencing chest discomfort, located substernally, feels as if " heart stops for 3 to 6 seconds."  Followed by a sensation of speeding heart along with diaphoresis and visual disturbances.  These episodes are sporadic, he has had at least 3 episodes over the last 1 month, no syncopal events.  These episodes last less than 1 minute.  No worsening factors, usually improves after resting and deep breathing exercises.  When these episodes have surfaced he is asked one of his buddies to run ECG strip that is readily available and performed by EMTs.  He brings in these rhythm strips for review and the underlying rhythm is predominantly sinus tachycardia with Q waves in the inferior leads suggestive of possible old inferior infarct.  Since the symptoms have been ongoing for the last couple months we discussed if there is been any changes in either his diagnoses or medications.  Patient states that he is currently being  worked up for anxiety and ADHD.  He used to be on sertraline and Adderall for 1 month and over the last 2 weeks has been on Concerta and Effexor.  Patient also brings in his most recent labs for review which are noted below for further reference.  There is a marked increase in his overall lipid profile since the initiation of his recent anxiety and ADHD medications.  Patient has had 2 episodes of pericarditis at the age of 34 and 89 according to him.  Family history of premature CAD with father having a myocardial infarction less than 53 years of age and passed away by the age of 72.  And paternal grandfather also had heart disease.   FUNCTIONAL STATUS: Once a week works for 30-40 minutes and weight lifting.    ALLERGIES: Allergies  Allergen Reactions   Amoxicillin Other (See Comments)    Childhood Reaction.   Penicillins     Precaution since reaction to amoxicillin   Clindamycin/Lincomycin Rash    MEDICATION LIST PRIOR TO VISIT: Current Meds  Medication Sig   albuterol (VENTOLIN HFA) 108 (90 Base) MCG/ACT inhaler Inhale 1-2 puffs into the lungs every 6 (six) hours as needed for wheezing or shortness of breath.   cyclobenzaprine (FLEXERIL) 10 MG tablet Take 1 tablet (10 mg total) by mouth 2 (two) times daily as needed for muscle spasms.   methylphenidate 18 MG PO CR tablet Take 18 mg by mouth daily.   olmesartan (BENICAR) 40 MG tablet Take 40 mg by mouth daily.  ondansetron (ZOFRAN ODT) 4 MG disintegrating tablet Take 1 tablet (4 mg total) by mouth every 8 (eight) hours as needed for nausea or vomiting.   rosuvastatin (CRESTOR) 10 MG tablet Take 10 mg by mouth at bedtime.   venlafaxine XR (EFFEXOR-XR) 37.5 MG 24 hr capsule Take 37.5 mg by mouth daily.     PAST MEDICAL HISTORY: Past Medical History:  Diagnosis Date   Allergy    Anxiety    Asthma    Hyperlipidemia    Hypertension    Lipoma    Migraine    Pericarditis    Pericarditis    Prediabetes     PAST SURGICAL  HISTORY: Past Surgical History:  Procedure Laterality Date   TONSILLECTOMY  age 79   tubes in ears   tubes      FAMILY HISTORY: The patient family history includes Cancer in his father and paternal grandfather; Coronary artery disease in his father; Diabetes in his father; Heart disease in his father and paternal grandmother; Hyperlipidemia in his father and mother; Hypertension in his father and mother; Migraines in his mother.  SOCIAL HISTORY:  The patient  reports that he quit smoking about 13 years ago. His smoking use included cigarettes. He has a 0.25 pack-year smoking history. He has never used smokeless tobacco. He reports current alcohol use. He reports that he does not use drugs.  REVIEW OF SYSTEMS: Review of Systems  Constitutional: Positive for diaphoresis. Negative for chills and fever.  HENT:  Negative for hoarse voice and nosebleeds.   Eyes:  Negative for discharge, double vision and pain.  Cardiovascular:  Positive for chest pain and palpitations. Negative for claudication, dyspnea on exertion, leg swelling, near-syncope, orthopnea, paroxysmal nocturnal dyspnea and syncope.  Respiratory:  Negative for hemoptysis and shortness of breath.   Musculoskeletal:  Negative for muscle cramps and myalgias.  Gastrointestinal:  Negative for abdominal pain, constipation, diarrhea, hematemesis, hematochezia, melena, nausea and vomiting.  Neurological:  Negative for dizziness and light-headedness.   PHYSICAL EXAM: Vitals with BMI 10/01/2020 08/29/2020 10/04/2019  Height '6\' 1"'$  - -  Weight 254 lbs 250 lbs -  BMI [id number] - -  Systolic 0000000 [id number] 0000000  Diastolic 92 84 92  Pulse [id number] 92 77   Orthostatic VS for the past 72 hrs (Last 3 readings):  Orthostatic BP Patient Position BP Location Cuff Size Orthostatic Pulse  10/01/20 1101 118/83 Standing Left Arm Large 111  10/01/20 1100 116/84 Sitting Left Arm Large 93  10/01/20 1059 124/80 Supine Left Arm Large 81   CONSTITUTIONAL: Well-developed  and well-nourished. No acute distress.  SKIN: Skin is warm and dry. No rash noted. No cyanosis. No pallor. No jaundice.  No ecchymosis but tattoos present. HEAD: Normocephalic and atraumatic.  EYES: No scleral icterus MOUTH/THROAT: Moist oral membranes.  NECK: No JVD present. No thyromegaly noted. No carotid bruits  LYMPHATIC: No visible cervical adenopathy.  CHEST Normal respiratory effort. No intercostal retractions  LUNGS: Clear to auscultation bilaterally.  No stridor. No wheezes. No rales.  CARDIOVASCULAR: Regular, positive S1-S2, no murmurs rubs or gallops appreciated. ABDOMINAL: Soft, nontender, nondistended, positive bowel sounds in all 4 quadrants, no apparent ascites.  EXTREMITIES: No peripheral edema, 2+ DP and PT pulses bilaterally, warm to touch. HEMATOLOGIC: No significant bruising NEUROLOGIC: Oriented to person, place, and time. Nonfocal. Normal muscle tone.  PSYCHIATRIC: Normal mood and affect. Normal behavior. Cooperative  CARDIAC DATABASE: EKG: 10/01/2020: Normal sinus rhythm, 87 bpm, LVH per voltage criteria, consider old inferior infarct, without underlying ischemia  pattern.  Echocardiogram: No results found for this or any previous visit from the past 1095 days.   Stress Testing: No results found for this or any previous visit from the past 1095 days.  Heart Catheterization: None  LABORATORY DATA: CBC Latest Ref Rng & Units 02/26/2015  WBC 4.0 - 10.5 K/uL 16.1(H)  Hemoglobin 13.0 - 17.0 g/dL 16.5  Hematocrit 39.0 - 52.0 % 46.3  Platelets 150 - 400 K/uL 212    CMP Latest Ref Rng & Units 02/26/2015 04/05/2006  Glucose 65 - 99 mg/dL 108(H) 97  BUN 6 - 20 mg/dL 16 12  Creatinine 0.61 - 1.24 mg/dL 1.15 1.0  Sodium 135 - 145 mmol/L 137 140  Potassium 3.5 - 5.1 mmol/L 4.2 3.6  Chloride 101 - 111 mmol/L 101 102  CO2 22 - 32 mmol/L 23 31  Calcium 8.9 - 10.3 mg/dL 9.5 9.9    Lipid Panel  No results found for: CHOL, TRIG, HDL, CHOLHDL, VLDL, LDLCALC, LDLDIRECT,  LABVLDL  No components found for: NTPROBNP No results for input(s): PROBNP in the last 8760 hours. No results for input(s): TSH in the last 8760 hours.  BMP No results for input(s): NA, K, CL, CO2, GLUCOSE, BUN, CREATININE, CALCIUM, GFRNONAA, GFRAA in the last 8760 hours.  HEMOGLOBIN A1C No results found for: HGBA1C, MPG  External Labs: Collected: 09/09/2020 provided by the patient White count 10.1, hemoglobin 16.3 g/dL, hematocrit 46.3%, platelets 229 Sodium 137, potassium 4.7, chloride 99, bicarb 23, BUN 16, creatinine 1.05. AST 28, ALT 41, alkaline phosphatase 73 TSH 2.18 Total cholesterol 239, triglycerides 316, HDL 39, LDL 142 mg/dL. Hemoglobin A1c 6.1  Lipid profile: Collected: 06/19/2020 Total cholesterol 177, triglycerides 200, HDL 35, LDL 107.  IMPRESSION:    ICD-10-CM   1. Precordial pain  R07.2 EKG 12-Lead    PCV ECHOCARDIOGRAM COMPLETE    2. Palpitations  R00.2 LONG TERM MONITOR (3-14 DAYS)    3. Pure hypercholesterolemia  E78.00     4. Hypertriglyceridemia  E78.1     5. Family history of premature CAD  Z82.49     6. Prediabetes  R73.03     7. History of pericarditis  Z86.79        RECOMMENDATIONS: Glenn Burton is a 35 y.o. male whose past medical history and cardiac risk factors include: Anxiety, ADHD, hypertension, hyperlipidemia, family history of premature CAD, history of pericarditis, former smoker.  Precordial pain: Patient's symptoms are pretty nonspecific as noted above. He has multiple cardiovascular risk factors including family history of premature CAD, former smoker, hypertension, hyperlipidemia, hypertriglyceridemia.  Patient should undergo an ischemic evaluation to evaluate for CAD. EKG shows normal sinus rhythm, LVH per voltage criteria, and findings concerning for possible old inferior infarct. Echocardiogram will be ordered to evaluate for structural heart disease and left ventricular systolic function. Once his ventricular rate is  better controlled would recommend coronary CTA for further evaluation.  Palpitations/tachycardia: I suspect that his symptoms of palpitation and tachycardia that have been present recently most likely secondary to his antianxiety and ADHD medications. Since initiation of these drugs his lipid profile is also significantly changed as noted above. I have asked both the patient and his wife to discuss this further with the prescribing provider and consider alternative medications if possible. TSH and hemoglobin within normal limits. 7-day extended Holter monitor to evaluate for dysrhythmias.  Family history of premature CAD: Patient states that his father had a myocardial event in his 66s. Given his risk factors and symptoms would like  to proceed with coronary CTA once his ventricular rate is better controlled.  If coronary CTA is not approved by insurance the alternative could be exercise treadmill stress test with coronary calcium score.  Hypercholesterolemia: Suspect either [place] versus medication induced. No evidence of lipid deposits on physical examination. Most recent lipid profile independently reviewed and noted above for further reference. Currently managed by PCP Would highly recommend transitioning him to a different antianxiety/ADHD medication as this may be playing a significant role in his symptoms as well as laboratory findings.  Patient is to discuss this further with the prescribing provider.  Plan of care discussed with both the patient and his wife at today's visit.  Their questions and concerns addressed to their satisfaction.  Further recommendations to follow.  Thank you for allowing [place] to participate in the care of Glenn Burton please reach out if any questions or concerns arise.  FINAL MEDICATION LIST END OF ENCOUNTER: No orders of the defined types were placed in this encounter.    Current Outpatient Medications:    albuterol (VENTOLIN HFA) 108 (90 Base) MCG/ACT  inhaler, Inhale 1-2 puffs into the lungs every 6 (six) hours as needed for wheezing or shortness of breath., Disp: 1 each, Rfl: 0   cyclobenzaprine (FLEXERIL) 10 MG tablet, Take 1 tablet (10 mg total) by mouth 2 (two) times daily as needed for muscle spasms., Disp: 20 tablet, Rfl: 0   methylphenidate 18 MG PO CR tablet, Take 18 mg by mouth daily., Disp: , Rfl:    olmesartan (BENICAR) 40 MG tablet, Take 40 mg by mouth daily., Disp: , Rfl:    ondansetron (ZOFRAN ODT) 4 MG disintegrating tablet, Take 1 tablet (4 mg total) by mouth every 8 (eight) hours as needed for nausea or vomiting., Disp: 20 tablet, Rfl: 0   rosuvastatin (CRESTOR) 10 MG tablet, Take 10 mg by mouth at bedtime., Disp: , Rfl:    venlafaxine XR (EFFEXOR-XR) 37.5 MG 24 hr capsule, Take 37.5 mg by mouth daily., Disp: , Rfl:   Orders Placed This Encounter  Procedures   LONG TERM MONITOR (3-14 DAYS)   EKG 12-Lead   PCV ECHOCARDIOGRAM COMPLETE    There are no Patient Instructions on file for this visit.   --Continue cardiac medications as reconciled in final medication list. --Return in 4 weeks (on 10/29/2020) for Follow up, Chest pain, Palpitations. Or sooner if needed. --Continue follow-up with your primary care physician regarding the management of your other chronic comorbid conditions.  Patient's questions and concerns were addressed to his satisfaction. He voices understanding of the instructions provided during this encounter.   This note was created using a voice recognition software as a result there may be grammatical errors inadvertently enclosed that do not reflect the nature of this encounter. Every attempt is made to correct such errors.  Rex Kras, [place], [facility]  Pager: [phone] Office: [phone]

## 2020-10-01 ENCOUNTER — Ambulatory Visit: Payer: Managed Care, Other (non HMO)

## 2020-10-01 ENCOUNTER — Encounter: Payer: Self-pay | Admitting: Cardiology

## 2020-10-01 ENCOUNTER — Inpatient Hospital Stay: Payer: Managed Care, Other (non HMO)

## 2020-10-01 ENCOUNTER — Ambulatory Visit: Payer: Managed Care, Other (non HMO) | Admitting: Cardiology

## 2020-10-01 ENCOUNTER — Other Ambulatory Visit: Payer: Self-pay

## 2020-10-01 VITALS — BP 137/92 | HR 110 | Resp 16 | Ht 73.0 in | Wt 254.0 lb

## 2020-10-01 DIAGNOSIS — E7800 Pure hypercholesterolemia, unspecified: Secondary | ICD-10-CM

## 2020-10-01 DIAGNOSIS — R7303 Prediabetes: Secondary | ICD-10-CM

## 2020-10-01 DIAGNOSIS — R072 Precordial pain: Secondary | ICD-10-CM

## 2020-10-01 DIAGNOSIS — R002 Palpitations: Secondary | ICD-10-CM

## 2020-10-01 DIAGNOSIS — E781 Pure hyperglyceridemia: Secondary | ICD-10-CM

## 2020-10-01 DIAGNOSIS — Z8679 Personal history of other diseases of the circulatory system: Secondary | ICD-10-CM

## 2020-10-01 DIAGNOSIS — Z8249 Family history of ischemic heart disease and other diseases of the circulatory system: Secondary | ICD-10-CM

## 2020-10-03 ENCOUNTER — Telehealth: Payer: Self-pay

## 2020-10-03 NOTE — Telephone Encounter (Signed)
Please make a note if he will be submitting two monitors and combine the data into one order.

## 2020-10-06 ENCOUNTER — Ambulatory Visit
Admission: RE | Admit: 2020-10-06 | Discharge: 2020-10-06 | Disposition: A | Discharge status: Home / Self Care | Payer: Managed Care, Other (non HMO) | Source: Ambulatory Visit | Attending: Nurse Practitioner | Admitting: Nurse Practitioner

## 2020-10-06 ENCOUNTER — Other Ambulatory Visit: Payer: Self-pay

## 2020-10-06 VITALS — BP 144/80 | HR 86 | Temp 97.8°F | Resp 16

## 2020-10-06 DIAGNOSIS — H8113 Benign paroxysmal vertigo, bilateral: Secondary | ICD-10-CM

## 2020-10-06 DIAGNOSIS — R002 Palpitations: Secondary | ICD-10-CM

## 2020-10-06 DIAGNOSIS — R110 Nausea: Secondary | ICD-10-CM

## 2020-10-06 DIAGNOSIS — H6993 Unspecified Eustachian tube disorder, bilateral: Secondary | ICD-10-CM

## 2020-10-06 HISTORY — DX: Pure hypercholesterolemia, unspecified: E78.00

## 2020-10-06 HISTORY — DX: Tachycardia, unspecified: R00.0

## 2020-10-06 HISTORY — DX: Attention-deficit hyperactivity disorder, unspecified type: F90.9

## 2020-10-06 MED ORDER — ONDANSETRON 8 MG PO TBDP: 8.0000 mg | ORAL_TABLET | Freq: Three times a day (TID) | ORAL | 0 refills | Status: DC | PRN

## 2020-10-06 MED ORDER — MECLIZINE HCL 25 MG PO TABS: 25.0000 mg | ORAL_TABLET | Freq: Three times a day (TID) | ORAL | 0 refills | Status: DC | PRN

## 2020-10-06 MED ORDER — FLUTICASONE PROPIONATE 50 MCG/ACT NA SUSP: 2.0000 | Freq: Every day | NASAL | 0 refills | Status: DC

## 2020-10-06 NOTE — ED Triage Notes (Addendum)
States he has frequent problems with his ears, due to go get eustachian tube stenting in left ear soon. Having left ear itching and c/o new right ear discomfort over the last four days. C/o dizziness and nausea at onset of ear problems.

## 2020-10-06 NOTE — Discharge Instructions (Addendum)
Take medications as prescribed.   Do not drive if you are dizzy.   Follow-up with ENT at [facility] for further evaluation of your eustachian tube dysfunction.   Follow-up with cardiology as scheduled to have your Holter monitor removed and interpreted.   Go to the ED immediately if your symptoms get worse.

## 2020-10-06 NOTE — ED Provider Notes (Signed)
[facility]    CSN: [id number] Arrival date & time: 10/06/20  1202      History   Chief Complaint Chief Complaint  Patient presents with   Otalgia   Dizziness   Nausea    HPI Glenn Burton is a 35 y.o. male.   Subjective:  Glenn Burton is a 35 y.o. male who is here for evaluation of dizziness and nausea. The dizziness and nausea has been present for the past 4 days. He describes the symptoms as disequalibirum. Symptoms are exacerbated by rapid head movements, looking upward, downward, to the right, to the left, rolling over in bed, rising from supine position, rising from squatting or sitting position, bending, motion, and driving. He also complains of bilateral aural pressure, tinnitus, hearing loss of the left ear, confusion, palpitations, headache and decreased ability to concentrate. These symptoms have been ongoing for several months. He is being evaluated by cardiology for the palpitations and is currently wearing a Holter monitor. He has seen an ENT in the past and has been referred to a eustachian tube disorder specialist in [place]. Patient reports that he missed a call from the specialist in [facility] several weeks ago and hasn't called them back to arrange follow-up. Patient reports history of anxiety and ADHD. He has not been prescribed any new medications for these problems but does report a decrease in the dosage of his Effexor. He was treated for a sinus infection about a month ago. He currently denies any sinus pain/pressure, drainage or congestion. No fevers, chills, myalgias, blurred vision, sore throat, vomiting, facial/limb paraesthesias, slurred speech, chest pain or shortness of breath. He has not tried anything for his current symptoms.   Outside reports reviewed: Office Notes from [facility]   The following portions of the patient's history were reviewed and updated as appropriate: allergies, current medications, past family  history, past medical history, past social history, past surgical history, and problem list.     Past Medical History:  Diagnosis Date   ADHD    Allergy    Anxiety    Asthma    Hypercholesteremia    Hyperlipidemia    Hypertension    Lipoma    Migraine    Pericarditis    Pericarditis    Prediabetes    Sinus tachycardia     Patient Active Problem List   Diagnosis Date Noted   Cough 08/29/2020   Migraine headache 01/19/2011    Past Surgical History:  Procedure Laterality Date   TONSILLECTOMY  age 4   tubes in ears   tubes         Home Medications    Prior to Admission medications   Medication Sig Start Date End Date Taking? Authorizing Provider  fluticasone (FLONASE) 50 MCG/ACT nasal spray Place 2 sprays into both nostrils daily. 10/06/20  Yes Enrique Sack, FNP  meclizine (ANTIVERT) 25 MG tablet Take 1 tablet (25 mg total) by mouth 3 (three) times daily as needed for dizziness. 10/06/20  Yes Enrique Sack, FNP  ondansetron (ZOFRAN ODT) 8 MG disintegrating tablet Take 1 tablet (8 mg total) by mouth every 8 (eight) hours as needed for nausea or vomiting. 10/06/20  Yes Enrique Sack, FNP  albuterol (VENTOLIN HFA) 108 (90 Base) MCG/ACT inhaler Inhale 1-2 puffs into the lungs every 6 (six) hours as needed for wheezing or shortness of breath. 08/29/20   Wieters, Hallie C, PA-C  cyclobenzaprine (FLEXERIL) 10 MG tablet Take 1 tablet (10 mg total) by mouth 2 (  two) times daily as needed for muscle spasms. 02/07/16   Larene Pickett, PA-C  methylphenidate 18 MG PO CR tablet Take 18 mg by mouth daily.    [provider]  olmesartan (BENICAR) 40 MG tablet Take 40 mg by mouth daily.    [provider]  rosuvastatin (CRESTOR) 10 MG tablet Take 10 mg by mouth at bedtime. 09/18/20   [provider]  venlafaxine XR (EFFEXOR-XR) 37.5 MG 24 hr capsule Take 37.5 mg by mouth daily. 09/12/20   [provider]    Family History Family History   Problem Relation Age of Onset   Migraines Mother    Hyperlipidemia Mother    Hypertension Mother    Diabetes Father    Cancer Father    Hypertension Father    Coronary artery disease Father    Heart disease Father    Hyperlipidemia Father    Heart disease Paternal Grandmother    Cancer Paternal Grandfather     Social History Social History   Tobacco Use   Smoking status: Former    Packs/day: 0.25    Years: 1.00    Pack years: 0.25    Types: Cigarettes    Quit date: 04/26/2007    Years since quitting: 13.4   Smokeless tobacco: Never  Vaping Use   Vaping Use: Former  Substance Use Topics   Alcohol use: Yes    Comment: Occasonal   Drug use: No     Allergies   Amoxicillin, Penicillins, and Clindamycin/lincomycin   Review of Systems Review of Systems  Constitutional:  Negative for activity change, appetite change, fatigue and fever.  HENT:  Positive for tinnitus. Negative for congestion, ear pain, sinus pressure and sore throat.   Respiratory:  Negative for shortness of breath.   Cardiovascular:  Positive for palpitations. Negative for chest pain.  Gastrointestinal:  Negative for nausea and vomiting.  Musculoskeletal:  Positive for neck pain and neck stiffness. Negative for arthralgias, joint swelling and myalgias.  Skin:  Negative for rash.  Neurological:  Positive for dizziness and headaches. Negative for tremors, seizures, syncope, facial asymmetry, speech difficulty and light-headedness.  Psychiatric/Behavioral:  Positive for confusion and decreased concentration. Negative for hallucinations, sleep disturbance and suicidal ideas. The patient is nervous/anxious.   All other systems reviewed and are negative.   Physical Exam Triage Vital Signs ED Triage Vitals  Enc Vitals Group     BP 10/06/20 1217 (!) 144/80     Pulse Rate 10/06/20 1217 86     Resp 10/06/20 1217 16     Temp 10/06/20 1217 97.8 F (36.6 C)     Temp Source 10/06/20 1217 Oral     SpO2  10/06/20 1217 97 %     Weight --      Height --      Head Circumference --      Peak Flow --      Pain Score 10/06/20 1220 0     Pain Loc --      Pain Edu? --      Excl. in [place]? --    No data found.  Updated Vital Signs BP (!) 144/80 (BP Location: Right Arm)   Pulse 86   Temp 97.8 F (36.6 C) (Oral)   Resp 16   SpO2 97%   Visual Acuity Right Eye Distance:   Left Eye Distance:   Bilateral Distance:    Right Eye Near:   Left Eye Near:    Bilateral Near:  Physical Exam Vitals reviewed.  Constitutional:      General: He is not in acute distress.    Appearance: Normal appearance. He is not ill-appearing, toxic-appearing or diaphoretic.  HENT:     Head: Normocephalic.     Right Ear: Tympanic membrane, ear canal and external ear normal.     Left Ear: Tympanic membrane, ear canal and external ear normal.     Nose: Nose normal.     Mouth/Throat:     Mouth: Mucous membranes are moist.     Pharynx: Oropharynx is clear.  Eyes:     Extraocular Movements: Extraocular movements intact.     Conjunctiva/sclera: Conjunctivae normal.     Pupils: Pupils are equal, round, and reactive to light.  Neck:     Trachea: Trachea normal.     Meningeal: Brudzinski's sign and Kernig's sign absent.  Cardiovascular:     Rate and Rhythm: Normal rate and regular rhythm.     Pulses: Normal pulses.     Heart sounds: Normal heart sounds.  Pulmonary:     Effort: Pulmonary effort is normal.     Breath sounds: Normal breath sounds.  Chest:     Chest wall: No tenderness.  Musculoskeletal:        General: Normal range of motion.     Cervical back: Full passive range of motion without pain, normal range of motion and neck supple. No rigidity or tenderness.     Right lower leg: No edema.     Left lower leg: No edema.  Lymphadenopathy:     Cervical: No cervical adenopathy.  Skin:    General: Skin is warm and dry.  Neurological:     General: No focal deficit present.     Mental Status: He is  alert and oriented to person, place, and time.     GCS: GCS eye subscore is 4. GCS verbal subscore is 5. GCS motor subscore is 6.     Cranial Nerves: Cranial nerves are intact.     Sensory: Sensation is intact.     Motor: Motor function is intact.     Coordination: Coordination is intact.     Deep Tendon Reflexes: Reflexes are normal and symmetric.  Psychiatric:        Mood and Affect: Mood normal.        Behavior: Behavior normal.        Thought Content: Thought content normal.        Judgment: Judgment normal.     UC Treatments / Results  Labs (all labs ordered are listed, but only abnormal results are displayed) Labs Reviewed - No data to display  EKG   Radiology No results found.  Procedures Procedures (including critical care time)  Medications Ordered in UC Medications - No data to display  Initial Impression / Assessment and Plan / UC Course  I have reviewed the triage vital signs and the nursing notes.  Pertinent labs & imaging results that were available during my care of the patient were reviewed by me and considered in my medical decision making (see chart for details).     35 year old male with history of anxiety, ADHD and eustachian tube dysfunction presenting with dizziness and nausea. He also complains of bilateral aural pressure, tinnitus, hearing loss of the left ear, confusion, palpitations, headache and decreased ability to concentrate.  The symptoms have been ongoing for several months now.  He is currently being evaluated by cardiology and has been referred to a ENT specialist in  [place].  Patient is alert and orient x3.  Afebrile.  No focal neurodeficits noted.  Plan:  Meclizine per medication orders. OTC decongestants per label instructions  Flonase daily  The nature of vertigo syndromes were discussed along with their usual course and treatment. Educational materials given and questions answered. No driving while symptomatic  Follow-up with  cardiology & ENT specialist at [facility] evaluation has revealed no signs of a dangerous process. Discussed diagnosis with patient and/or guardian. Patient and/or guardian aware of their diagnosis, possible red flag symptoms to watch out for and need for close follow up. Patient and/or guardian understands verbal and written discharge instructions. Patient and/or guardian comfortable with plan and disposition.  Patient and/or guardian has a clear mental status at this time, good insight into illness (after discussion and teaching) and has clear judgment to make decisions regarding their care  This care was provided during an unprecedented National Emergency due to the Novel Coronavirus (COVID-19) pandemic. COVID-19 infections and transmission risks place heavy strains on healthcare resources.  As this pandemic evolves, our facility, providers, and staff strive to respond fluidly, to remain operational, and to provide care relative to available resources and information. Outcomes are unpredictable and treatments are without well-defined guidelines. Further, the impact of COVID-19 on all aspects of urgent care, including the impact to patients seeking care for reasons other than COVID-19, is unavoidable during this national emergency. At this time of the global pandemic, management of patients has significantly changed, even for non-COVID positive patients given high local and regional COVID volumes at this time requiring high healthcare system and resource utilization. The standard of care for management of both COVID suspected and non-COVID suspected patients continues to change rapidly at the local, regional, national, and global levels. This patient was worked up and treated to the best available but ever changing evidence and resources available at this current time.   Documentation was completed with the aid of voice recognition software. Transcription may contain typographical errors.  Final  Clinical Impressions(s) / UC Diagnoses   Final diagnoses:  Eustachian tube disorder, bilateral  Nausea without vomiting  Palpitations  Benign paroxysmal positional vertigo due to bilateral vestibular disorder     Discharge Instructions      Take medications as prescribed.   Do not drive if you are dizzy.   Follow-up with ENT at [facility] for further evaluation of your eustachian tube dysfunction.   Follow-up with cardiology as scheduled to have your Holter monitor removed and interpreted.   Go to the ED immediately if your symptoms get worse.      ED Prescriptions     Medication Sig Dispense Auth. Provider   meclizine (ANTIVERT) 25 MG tablet Take 1 tablet (25 mg total) by mouth 3 (three) times daily as needed for dizziness. 30 tablet Enrique Sack, FNP   ondansetron (ZOFRAN ODT) 8 MG disintegrating tablet Take 1 tablet (8 mg total) by mouth every 8 (eight) hours as needed for nausea or vomiting. 20 tablet Enrique Sack, FNP   fluticasone (FLONASE) 50 MCG/ACT nasal spray Place 2 sprays into both nostrils daily. 16 g Enrique Sack, FNP      PDMP not reviewed this encounter.   Enrique Sack,  10/06/20 1434

## 2020-10-28 NOTE — Progress Notes (Signed)
Date:  10/01/2020   ID:  Glenn Burton, DOB 03/11/1985, MRN [id number]  PCP:  Glenn Bien, MD  Cardiologist:  Glenn Kras, DO, [facility]  (established care 10/01/2020)  REASON FOR CONSULT: Tachycardia  REQUESTING PHYSICIAN:  Glenn Bien, MD [address] [place],  [place] [address]  Chief Complaint  Patient presents with   Chest Pain   Follow-up   Palpitations    HPI  Glenn Burton is a 35 y.o. male who presents to the office with a chief complaint of " tachycardia, chest pain." Patient's past medical history and cardiovascular risk factors include: Anxiety, ADHD, hypertension, hyperlipidemia, family history of premature CAD, history of pericarditis, former smoker.  Patient has had 2 episodes of pericarditis at the age of 15 and 70 according to him.  Family history of premature CAD with father having a myocardial infarction less than 68 years of age and passed away by the age of 51.  And paternal grandfather also had heart disease.  He was originally referred to the office at the request of Glenn Bien, MD for evaluation of tachycardia.  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   Patient presents for 4-week follow-up of chest pain and palpitations to discuss results of cardiac testing.  Given patient's multiple cardiovascular risk factors and presenting symptoms He underwent echocardiogram which revealed normal LVEF and no other abnormalities.  He also underwent 7-day cardiac monitor which revealed no significant cardiac arrhythmias and patient symptoms correlated with sinus rhythm at rate of 106-155 bpm.  Also recommended coronary CTA after patient's Ventricular rate better controlled.  Patient is also advised to discuss alternative antianxiety/ADHD medications as there is concern these medications may be playing a role in patient's presentation with tachycardia and hyperlipidemia.  This visit patient has  followed up with primary as well as provider who is managing ADHD.  PCP has discontinued Effexor and he has been switched from methylphenidate to guanfacine for management of ADHD.  Patient continues to have episodes of tachycardia and diaphoresis as well as chest discomfort.   FUNCTIONAL STATUS: Once a week works for 30-40 minutes and weight lifting.    ALLERGIES: Allergies  Allergen Reactions   Amoxicillin Other (See Comments)    Childhood Reaction.   Penicillins     Precaution since reaction to amoxicillin   Clindamycin/Lincomycin Rash    MEDICATION LIST PRIOR TO VISIT: Current Meds  Medication Sig   albuterol (VENTOLIN HFA) 108 (90 Base) MCG/ACT inhaler Inhale 1-2 puffs into the lungs every 6 (six) hours as needed for wheezing or shortness of breath.   cyclobenzaprine (FLEXERIL) 10 MG tablet Take 1 tablet (10 mg total) by mouth 2 (two) times daily as needed for muscle spasms.   guanFACINE (INTUNIV) 1 MG TB24 ER tablet Take 1 mg by mouth daily.   meclizine (ANTIVERT) 25 MG tablet Take 1 tablet (25 mg total) by mouth 3 (three) times daily as needed for dizziness.   metoprolol tartrate (LOPRESSOR) 50 MG tablet Take 1 tablet (50 mg total) by mouth 2 (two) times daily.   olmesartan (BENICAR) 40 MG tablet Take 40 mg by mouth daily.   ondansetron (ZOFRAN ODT) 8 MG disintegrating tablet Take 1 tablet (8 mg total) by mouth every 8 (eight) hours as needed for nausea or vomiting.   rosuvastatin (CRESTOR) 10 MG tablet Take 10 mg by mouth at bedtime.     PAST MEDICAL HISTORY: Past Medical  History:  Diagnosis Date   ADHD    Allergy    Anxiety    Asthma    Hypercholesteremia    Hyperlipidemia    Hypertension    Lipoma    Migraine    Pericarditis    Pericarditis    Prediabetes    Sinus tachycardia     PAST SURGICAL HISTORY: Past Surgical History:  Procedure Laterality Date   TONSILLECTOMY  age 64   tubes in ears   tubes      FAMILY HISTORY: The patient family history  includes Cancer in his father and paternal grandfather; Coronary artery disease in his father; Diabetes in his father; Heart disease in his father and paternal grandmother; Hyperlipidemia in his father and mother; Hypertension in his father and mother; Migraines in his mother.  SOCIAL HISTORY:  The patient  reports that he quit smoking about 13 years ago. His smoking use included cigarettes. He has a 0.25 pack-year smoking history. He has never used smokeless tobacco. He reports current alcohol use. He reports that he does not use drugs.  REVIEW OF SYSTEMS: Review of Systems  Constitutional: Positive for diaphoresis. Negative for chills and fever.  Cardiovascular:  Positive for chest pain and palpitations. Negative for claudication, dyspnea on exertion, leg swelling, near-syncope, orthopnea, paroxysmal nocturnal dyspnea and syncope.  Neurological:  Positive for light-headedness. Negative for dizziness.   PHYSICAL EXAM: Vitals with BMI 10/29/2020 10/29/2020 10/06/2020  Height - 6\' 1"  -  Weight - 255 lbs -  BMI - 99.83 -  Systolic 382 505 397  Diastolic 86 99 80  Pulse 95 102 86   Orthostatic VS for the past 72 hrs (Last 3 readings):  Patient Position BP Location Cuff Size  10/29/20 1517 Sitting Left Arm Large  10/29/20 1513 Sitting Left Arm Normal   Physical Exam Vitals reviewed.  HENT:     Head: Normocephalic and atraumatic.  Cardiovascular:     Rate and Rhythm: Normal rate and regular rhythm.     Pulses: Intact distal pulses.     Heart sounds: S1 normal and S2 normal. No murmur heard.   No gallop.  Pulmonary:     Effort: Pulmonary effort is normal. No respiratory distress.     Breath sounds: No wheezing, rhonchi or rales.  Musculoskeletal:     Right lower leg: No edema.     Left lower leg: No edema.  Neurological:     Mental Status: He is alert.    CARDIAC DATABASE: 7 day extended Holter monitor: Patch Wear Time:  5 days and 3 hours  Dominant rhythm normal sinus, followed  by sinus tachycardia.  Heart rate 49-172 bpm.  Avg HR 95 bpm. No atrial fibrillation, supraventricular or ventricular tachycardia, high grade AV block, pauses (3 seconds or longer). Total ventricular ectopic burden <1%. Total supraventricular ectopic burden <1%. Patient triggered events: 5.  Underlying rhythm sinus tachycardia without dysrhythmias.  EKG: 10/01/2020: Normal sinus rhythm, 87 bpm, LVH per voltage criteria, consider old inferior infarct, without underlying ischemia pattern.  Echocardiogram: 10/01/2020 Left ventricle cavity is normal in size and wall thickness. Normal global wall motion. Normal LV systolic function with EF 60%. Normal diastolic filling pattern. Mild pulmonic regurgitation. No evidence of pulmonary hypertension.    Stress Testing: No results found for this or any previous visit from the past 1095 days.  Heart Catheterization: None  LABORATORY DATA: CBC Latest Ref Rng & Units 02/26/2015  WBC 4.0 - 10.5 K/uL 16.1(H)  Hemoglobin 13.0 - 17.0 g/dL  16.5  Hematocrit 39.0 - 52.0 % 46.3  Platelets 150 - 400 K/uL 212    CMP Latest Ref Rng & Units 02/26/2015 04/05/2006  Glucose 65 - 99 mg/dL 108(H) 97  BUN 6 - 20 mg/dL 16 12  Creatinine 0.61 - 1.24 mg/dL 1.15 1.0  Sodium 135 - 145 mmol/L 137 140  Potassium 3.5 - 5.1 mmol/L 4.2 3.6  Chloride 101 - 111 mmol/L 101 102  CO2 22 - 32 mmol/L 23 31  Calcium 8.9 - 10.3 mg/dL 9.5 9.9    Lipid Panel  No results found for: CHOL, TRIG, HDL, CHOLHDL, VLDL, LDLCALC, LDLDIRECT, LABVLDL  No components found for: NTPROBNP No results for input(s): PROBNP in the last 8760 hours. No results for input(s): TSH in the last 8760 hours.  BMP No results for input(s): NA, K, CL, CO2, GLUCOSE, BUN, CREATININE, CALCIUM, GFRNONAA, GFRAA in the last 8760 hours.  HEMOGLOBIN A1C No results found for: HGBA1C, MPG  External Labs: Collected: 09/09/2020 provided by the patient White count 10.1, hemoglobin 16.3 g/dL, hematocrit 46.3%,  platelets 229 Sodium 137, potassium 4.7, chloride 99, bicarb 23, BUN 16, creatinine 1.05. AST 28, ALT 41, alkaline phosphatase 73 TSH 2.18 Total cholesterol 239, triglycerides 316, HDL 39, LDL 142 mg/dL. Hemoglobin A1c 6.1  Lipid profile: Collected: 06/19/2020 Total cholesterol 177, triglycerides 200, HDL 35, LDL 107.  IMPRESSION:    ICD-10-CM   1. Precordial pain  R07.2 CT CORONARY MORPH W/CTA COR W/SCORE W/CA W/CM &/OR WO/CM    2. Palpitations  R00.2     3. Pure hypercholesterolemia  E78.00     4. Hypertriglyceridemia  E78.1     5. Family history of premature CAD  Z82.49     6. Sinus tachycardia  R00.0        RECOMMENDATIONS: Rhea Thrun is a 35 y.o. male whose past medical history and cardiac risk factors include: Anxiety, ADHD, hypertension, hyperlipidemia, family history of premature CAD, history of pericarditis, former smoker.  Precordial pain: Cardiovascular risk factors include: family history of premature CAD, former smoker, hypertension, hyperlipidemia, hypertriglyceridemia. Given multiple cardiovascular risk factors recommend further ischemic evaluation. Will start patient on Lopressor 50 mg twice daily which will improve ventricular rate. Will therefore proceed with coronary CTA for further evaluation of patient's chest pain.  Reviewed and discussed with patient echocardiogram, details above. Echo revealed normal LVEF and no significant abnormality.   Palpitations/Sinus tachycardia: Reviewed and discussed with patient results of cardiac monitor, details above.  Cardiac monitor revealed no cardiac dysrhythmias.  Patient symptoms correlated with sinus tachycardia. Will start Lopressor 50 mg twice daily.  Patient will monitor heart rate and blood pressure and notify our office if he experiences any issues or questions. He is also followed up with PCP and provider managing his ADHD medications.  He has no longer taking stimulant medication.  Suspect this will also  improve patient's sinus tachycardia. TSH and hemoglobin within normal limits.  Family history of premature CAD: Patient states that his father had a myocardial event in his 79s. Given multiple cardiovascular risk factors and symptoms of precordial pain we will proceed with coronary CTA.  If coronary CT is not approved by insurance the alternative could be exercise treadmill stress test with coronary calcium score.  Hypercholesterolemia: Suspect either [place] versus medication induced. No evidence of lipid deposits on physical examination. Since last office visit patient has been transitioned off of methylphenidate to guanfacine and has stopped Effexor.  I will defer repeat lipid profile testing to PCP, as  suspect hyperlipidemia may have been related to previous adjustments to antianxiety/ADHD medications. Continue rosuvastatin 10 mg.  Will defer further management to PCP at this time.   Plan of care discussed with both the patient and his wife at today's visit. Further recommendations to follow.  Follow-up in 6 to 8 weeks following coronary CTA.  Thank you for allowing [place] to participate in the care of Harrol Novello please reach out if any questions or concerns arise.  FINAL MEDICATION LIST END OF ENCOUNTER: Meds ordered this encounter  Medications   metoprolol tartrate (LOPRESSOR) 50 MG tablet    Sig: Take 1 tablet (50 mg total) by mouth 2 (two) times daily.    Dispense:  60 tablet    Refill:  3     Current Outpatient Medications:    albuterol (VENTOLIN HFA) 108 (90 Base) MCG/ACT inhaler, Inhale 1-2 puffs into the lungs every 6 (six) hours as needed for wheezing or shortness of breath., Disp: 1 each, Rfl: 0   cyclobenzaprine (FLEXERIL) 10 MG tablet, Take 1 tablet (10 mg total) by mouth 2 (two) times daily as needed for muscle spasms., Disp: 20 tablet, Rfl: 0   guanFACINE (INTUNIV) 1 MG TB24 ER tablet, Take 1 mg by mouth daily., Disp: , Rfl:    meclizine (ANTIVERT) 25 MG tablet, Take  1 tablet (25 mg total) by mouth 3 (three) times daily as needed for dizziness., Disp: 30 tablet, Rfl: 0   metoprolol tartrate (LOPRESSOR) 50 MG tablet, Take 1 tablet (50 mg total) by mouth 2 (two) times daily., Disp: 60 tablet, Rfl: 3   olmesartan (BENICAR) 40 MG tablet, Take 40 mg by mouth daily., Disp: , Rfl:    ondansetron (ZOFRAN ODT) 8 MG disintegrating tablet, Take 1 tablet (8 mg total) by mouth every 8 (eight) hours as needed for nausea or vomiting., Disp: 20 tablet, Rfl: 0   rosuvastatin (CRESTOR) 10 MG tablet, Take 10 mg by mouth at bedtime., Disp: , Rfl:   Orders Placed This Encounter  Procedures   CT CORONARY MORPH W/CTA COR W/SCORE W/CA W/CM &/OR WO/CM    There are no Patient Instructions on file for this visit.   --Continue cardiac medications as reconciled in final medication list. --Return in about 8 weeks (around 12/24/2020) for tachycardia, cardiac results . Or sooner if needed. --Continue follow-up with your primary care physician regarding the management of your other chronic comorbid conditions.  This note was created using a voice recognition software as a result there may be grammatical errors inadvertently enclosed that do not reflect the nature of this encounter. Every attempt is made to correct such errors.   Alethia Berthold, PA-C 10/29/2020, 4:13 PM Office: [phone]

## 2020-10-29 ENCOUNTER — Other Ambulatory Visit: Payer: Self-pay

## 2020-10-29 ENCOUNTER — Encounter: Payer: Self-pay | Admitting: Student

## 2020-10-29 ENCOUNTER — Ambulatory Visit: Payer: Managed Care, Other (non HMO) | Admitting: Cardiology

## 2020-10-29 ENCOUNTER — Ambulatory Visit: Payer: Managed Care, Other (non HMO) | Admitting: Student

## 2020-10-29 VITALS — BP 138/86 | HR 95 | Ht 73.0 in | Wt 255.0 lb

## 2020-10-29 DIAGNOSIS — E7800 Pure hypercholesterolemia, unspecified: Secondary | ICD-10-CM

## 2020-10-29 DIAGNOSIS — R000 Tachycardia, unspecified: Secondary | ICD-10-CM

## 2020-10-29 DIAGNOSIS — E781 Pure hyperglyceridemia: Secondary | ICD-10-CM

## 2020-10-29 DIAGNOSIS — R002 Palpitations: Secondary | ICD-10-CM

## 2020-10-29 DIAGNOSIS — R072 Precordial pain: Secondary | ICD-10-CM

## 2020-10-29 DIAGNOSIS — Z8249 Family history of ischemic heart disease and other diseases of the circulatory system: Secondary | ICD-10-CM

## 2020-10-29 MED ORDER — METOPROLOL TARTRATE 50 MG PO TABS: 50.0000 mg | ORAL_TABLET | Freq: Two times a day (BID) | ORAL | 3 refills | Status: DC

## 2020-12-22 ENCOUNTER — Telehealth: Payer: Self-pay | Admitting: Cardiology

## 2020-12-22 NOTE — Telephone Encounter (Signed)
Patient has been left messages to schedule the CCTA, with no return phone calls/communcation.  He left a message with the answering service on 12/19/20 to cancel his appointment on 12/24/20 at our office.

## 2020-12-24 ENCOUNTER — Ambulatory Visit: Payer: Managed Care, Other (non HMO) | Admitting: Student

## 2021-05-14 ENCOUNTER — Telehealth (HOSPITAL_COMMUNITY): Payer: Self-pay | Admitting: *Deleted

## 2021-05-14 NOTE — Telephone Encounter (Signed)
Attempted to call patient regarding upcoming cardiac CT appointment. °Left message on voicemail with name and callback number ° °Mirian Casco RN Navigator Cardiac Imaging °[facility] °[phone] Office °[phone] Cell ° °

## 2021-05-17 ENCOUNTER — Ambulatory Visit (INDEPENDENT_AMBULATORY_CARE_PROVIDER_SITE_OTHER): Payer: Managed Care, Other (non HMO)

## 2021-05-17 ENCOUNTER — Ambulatory Visit
Admission: RE | Admit: 2021-05-17 | Discharge: 2021-05-17 | Payer: Managed Care, Other (non HMO) | Source: Ambulatory Visit | Attending: Family Medicine | Admitting: Family Medicine

## 2021-05-17 ENCOUNTER — Other Ambulatory Visit: Payer: Self-pay

## 2021-05-17 ENCOUNTER — Encounter (HOSPITAL_COMMUNITY): Payer: Self-pay | Admitting: *Deleted

## 2021-05-17 ENCOUNTER — Ambulatory Visit (HOSPITAL_COMMUNITY)
Admission: EM | Admit: 2021-05-17 | Discharge: 2021-05-17 | Disposition: A | Discharge status: Home / Self Care | Payer: Managed Care, Other (non HMO) | Attending: Family Medicine | Admitting: Family Medicine

## 2021-05-17 DIAGNOSIS — M79671 Pain in right foot: Secondary | ICD-10-CM

## 2021-05-17 DIAGNOSIS — M79674 Pain in right toe(s): Secondary | ICD-10-CM

## 2021-05-17 MED ORDER — INDOMETHACIN 50 MG PO CAPS: 50.0000 mg | ORAL_CAPSULE | Freq: Three times a day (TID) | ORAL | 0 refills | Status: DC | PRN

## 2021-05-17 NOTE — Discharge Instructions (Signed)
Your x-rays are normal.  I am concerned that you may have gout.  I will contact you with your lab work once I have these results.  Start indomethacin 3 times a day for pain.  Do not take NSAIDs with this medication including aspirin, ibuprofen/Advil, naproxen/Aleve.  You can use Tylenol for breakthrough pain.  If your symptoms are not improving quickly please follow-up with podiatry,: Call to schedule an appointment.  If you have any worsening symptoms including increased pain, fever, nausea, vomiting you need to be seen immediately. ?

## 2021-05-17 NOTE — ED Provider Notes (Addendum)
?[place] ? ? ? ?CSN: [id number] ?Arrival date & time: 05/17/21  1018 ? ? ?  ? ?History   ?Chief Complaint ?Chief Complaint  ?Patient presents with  ? Toe Injury  ?  Dropped lumber on my right foot with shoe Thursday.  Pain started 2am Saturday morning and woke me from sleep. Abnormal motor functions with walking at this time. Pain fluctuates with use. Redness and edema noted. - Entered by patient  ? ? ?HPI ?Glenn Burton is a 36 y.o. male.  ? ?Patient presents today with a several day history of severe right great toe pain.  He reports that approximately 3 to 4 days ago he dropped a piece of lumber on his foot while he was working outside.  He was wearing shoes and did not have significant pain following his injury.  He denied any significant pain for several days and then was awoken a few nights ago with severe sharp pain at his right great MTP.  He has tried soaks, wrapping, Flexeril without improvement of symptoms.  He reports pain is worse with ambulation or palpation.  He is having difficulty with daily duties as result of symptoms.  He denies any fever, vomiting, body aches, malaise.  Does report occasional waves of nausea associated with severe pain.  He denies history of diabetes but is prediabetic managed with [employer].  He denies any other medication changes.  He does not take a thiazide diuretic.  Denies any recent dietary changes or significant alcohol consumption.  Denies history of gout or rheumatoid arthritis. ? ? ?Past Medical History:  ?Diagnosis Date  ? ADHD   ? Allergy   ? Anxiety   ? Asthma   ? Hypercholesteremia   ? Hyperlipidemia   ? Hypertension   ? Lipoma   ? Migraine   ? Pericarditis   ? Pericarditis   ? Prediabetes   ? Sinus tachycardia   ? ? ?Patient Active Problem List  ? Diagnosis Date Noted  ? Cough 08/29/2020  ? Migraine headache 01/19/2011  ? ? ?Past Surgical History:  ?Procedure Laterality Date  ? TONSILLECTOMY  age 72  ? tubes in ears  ? tubes    ? ? ? ? ? ?Home  Medications   ? ?Prior to Admission medications   ?Medication Sig Start Date End Date Taking? Authorizing Provider  ?indomethacin (INDOCIN) 50 MG capsule Take 1 capsule (50 mg total) by mouth 3 (three) times daily as needed for moderate pain. 05/17/21  Yes Shahil Speegle, Junie Panning K, PA-C  ?albuterol (VENTOLIN HFA) 108 (90 Base) MCG/ACT inhaler Inhale 1-2 puffs into the lungs every 6 (six) hours as needed for wheezing or shortness of breath. 08/29/20   Wieters, Hallie C, PA-C  ?cyclobenzaprine (FLEXERIL) 10 MG tablet Take 1 tablet (10 mg total) by mouth 2 (two) times daily as needed for muscle spasms. 02/07/16   Larene Pickett, PA-C  ?guanFACINE (INTUNIV) 1 MG TB24 ER tablet Take 1 mg by mouth daily. 10/20/20   [provider]  ?meclizine (ANTIVERT) 25 MG tablet Take 1 tablet (25 mg total) by mouth 3 (three) times daily as needed for dizziness. 10/06/20   Enrique Sack, Power  ?metoprolol tartrate (LOPRESSOR) 50 MG tablet Take 1 tablet (50 mg total) by mouth 2 (two) times daily. 10/29/20 02/26/21  Cantwell, Celeste C, PA-C  ?olmesartan (BENICAR) 40 MG tablet Take 40 mg by mouth daily.    [provider]  ?ondansetron (ZOFRAN ODT) 8 MG disintegrating tablet Take 1 tablet (8  mg total) by mouth every 8 (eight) hours as needed for nausea or vomiting. 10/06/20   Enrique Sack, [place]  ?rosuvastatin (CRESTOR) 10 MG tablet Take 10 mg by mouth at bedtime. 09/18/20   [provider]  ? ? ?Family History ?Family History  ?Problem Relation Age of Onset  ? Migraines Mother   ? Hyperlipidemia Mother   ? Hypertension Mother   ? Diabetes Father   ? Cancer Father   ? Hypertension Father   ? Coronary artery disease Father   ? Heart disease Father   ? Hyperlipidemia Father   ? Heart disease Paternal Grandmother   ? Cancer Paternal Grandfather   ? ? ?Social History ?Social History  ? ?Tobacco Use  ? Smoking status: Former  ?  Packs/day: 0.25  ?  Years: 1.00  ?  Pack years: 0.25  ?  Types: Cigarettes  ?  Quit date: 04/26/2007   ?  Years since quitting: 14.0  ? Smokeless tobacco: Never  ?Vaping Use  ? Vaping Use: Former  ?Substance Use Topics  ? Alcohol use: Yes  ?  Comment: Occasonal  ? Drug use: No  ? ? ? ?Allergies   ?Amoxicillin, Penicillins, and Clindamycin/lincomycin ? ? ?Review of Systems ?Review of Systems  ?Constitutional:  Positive for activity change. Negative for appetite change, fatigue and fever.  ?Gastrointestinal:  Positive for nausea (Intermittent associated with pain). Negative for abdominal pain, diarrhea and vomiting.  ?Musculoskeletal:  Positive for arthralgias, gait problem and joint swelling. Negative for myalgias.  ?Skin:  Positive for color change.  ?Neurological:  Negative for dizziness, weakness, light-headedness, numbness and headaches.  ? ? ?Physical Exam ?Triage Vital Signs ?ED Triage Vitals  ?Enc Vitals Group  ?   BP 05/17/21 1035 126/88  ?   Pulse Rate 05/17/21 1035 85  ?   Resp 05/17/21 1035 16  ?   Temp 05/17/21 1035 98 ?F (36.7 ?C)  ?   Temp src --   ?   SpO2 05/17/21 1035 97 %  ?   Weight --   ?   Height --   ?   Head Circumference --   ?   Peak Flow --   ?   Pain Score 05/17/21 1032 3  ?   Pain Loc --   ?   Pain Edu? --   ?   Excl. in [place]? --   ? ?No data found. ? ?Updated Vital Signs ?BP 126/88   Pulse 85   Temp 98 ?F (36.7 ?C)   Resp 16   SpO2 97%  ? ?Visual Acuity ?Right Eye Distance:   ?Left Eye Distance:   ?Bilateral Distance:   ? ?Right Eye Near:   ?Left Eye Near:    ?Bilateral Near:    ? ?Physical Exam ?Vitals reviewed.  ?Constitutional:   ?   General: He is awake.  ?   Appearance: Normal appearance. He is well-developed. He is not ill-appearing.  ?   Comments: Very pleasant male appears stated age in no acute distress sitting comfortably in exam room  ?HENT:  ?   Head: Normocephalic and atraumatic.  ?Cardiovascular:  ?   Rate and Rhythm: Normal rate and regular rhythm.  ?   Heart sounds: Normal heart sounds, S1 normal and S2 normal. No murmur heard. ?   Comments: Capillary refill within 2  seconds right toes ?Pulmonary:  ?   Effort: Pulmonary effort is normal.  ?   Breath sounds: Normal breath sounds. No stridor. No  wheezing, rhonchi or rales.  ?   Comments: Clear to auscultation bilaterally ?Musculoskeletal:  ?   Right foot: Decreased range of motion. Normal capillary refill. Swelling, tenderness and bony tenderness present. No deformity or laceration.  ?   Comments: Right foot: Significant tenderness palpation at right great MTP joint.  Associated erythema and swelling at great MTP joint.  Decreased range of motion with flexion and extension of all right toes.  Normal sensation.  Foot neurovascularly intact.  ?Neurological:  ?   Mental Status: He is alert.  ?Psychiatric:     ?   Behavior: Behavior is cooperative.  ? ? ? ?UC Treatments / Results  ?Labs ?(all labs ordered are listed, but only abnormal results are displayed) ?Labs Reviewed - No data to display ? ? ?EKG ? ? ?Radiology ?DG Foot Complete Right ? ?Result Date: 05/17/2021 ?CLINICAL DATA:  Injury, pain and swelling. EXAM: RIGHT FOOT COMPLETE - 3+ VIEW COMPARISON:  None. FINDINGS: There is no evidence of fracture or dislocation. There is no evidence of arthropathy or other focal bone abnormality. Soft tissues are unremarkable. IMPRESSION: Negative. Electronically Signed   By: Franki Cabot M.D.   On: 05/17/2021 11:21  ? ?DG Toe Great Right ? ?Result Date: 05/17/2021 ?CLINICAL DATA:  Posttraumatic great toe pain on the right. Injury on Thursday EXAM: RIGHT GREAT TOE COMPARISON:  None. FINDINGS: There is no evidence of fracture or dislocation. There is no evidence of arthropathy or other focal bone abnormality. Soft tissues are unremarkable. IMPRESSION: Negative. Electronically Signed   By: Jorje Guild M.D.   On: 05/17/2021 11:01   ? ?Procedures ?Procedures (including critical care time) ? ?Medications Ordered in UC ?Medications - No data to display ? ?Initial Impression / Assessment and Plan / UC Course  ?I have reviewed the triage vital signs  and the nursing notes. ? ?Pertinent labs & imaging results that were available during my care of the patient were reviewed by me and considered in my medical decision making (see chart for details). ? ?  ? ?X-ray

## 2021-05-17 NOTE — ED Triage Notes (Signed)
Pt dropped 2x4 on RT great toe on Thursday and the pain started on SAt.  ?

## 2021-05-18 ENCOUNTER — Encounter (HOSPITAL_COMMUNITY): Payer: Self-pay

## 2021-05-18 ENCOUNTER — Ambulatory Visit (HOSPITAL_COMMUNITY)
Admission: RE | Admit: 2021-05-18 | Discharge: 2021-05-18 | Disposition: A | Discharge status: Home / Self Care | Payer: Managed Care, Other (non HMO) | Source: Ambulatory Visit | Attending: Student | Admitting: Student

## 2021-05-18 DIAGNOSIS — R072 Precordial pain: Secondary | ICD-10-CM | POA: Insufficient documentation

## 2021-05-18 MED ORDER — NITROGLYCERIN 0.4 MG SL SUBL
SUBLINGUAL_TABLET | SUBLINGUAL | Status: AC
  Filled 2021-05-18: qty 2

## 2021-05-18 MED ORDER — IOHEXOL 350 MG/ML SOLN
100.0000 mL | Freq: Once | INTRAVENOUS | Status: AC | PRN
  Administered 2021-05-18: 100 mL via INTRAVENOUS

## 2021-05-18 MED ORDER — NITROGLYCERIN 0.4 MG SL SUBL
0.8000 mg | SUBLINGUAL_TABLET | Freq: Once | SUBLINGUAL | Status: AC
  Administered 2021-05-18: 0.8 mg via SUBLINGUAL

## 2021-05-21 ENCOUNTER — Telehealth (HOSPITAL_COMMUNITY): Payer: Self-pay | Admitting: Emergency Medicine

## 2021-05-21 NOTE — Telephone Encounter (Signed)
Called patient and left a VM for patient to give a called back for permission to bill his insurance for the post op shoe given 05/17/2021 ?

## 2021-05-27 ENCOUNTER — Ambulatory Visit: Payer: Managed Care, Other (non HMO) | Admitting: Student

## 2021-05-27 ENCOUNTER — Encounter: Payer: Self-pay | Admitting: Student

## 2021-05-27 VITALS — BP 121/76 | HR 83 | Temp 98.0°F | Resp 17 | Ht 73.0 in | Wt 262.0 lb

## 2021-05-27 DIAGNOSIS — R072 Precordial pain: Secondary | ICD-10-CM

## 2021-05-27 DIAGNOSIS — R002 Palpitations: Secondary | ICD-10-CM

## 2021-05-27 NOTE — Progress Notes (Signed)
? ?Date:  10/01/2020  ? ?ID:  Glenn Burton, DOB 04-01-1985, MRN [id number] ? ?PCP:  Fanny Bien, MD  ?Cardiologist:  Rex Kras, DO, [facility]  (established care 10/01/2020) ? ?REASON FOR CONSULT: Tachycardia ? ?REQUESTING PHYSICIAN:  ?Fanny Bien, MD ?[address],  [place] [address] ? ?Chief Complaint  ?Patient presents with  ? Follow-up  ? CALCIUM SCORE  ? ? ?HPI  ?Glenn Burton is a 36 y.o. male who presents to the office with a chief complaint of " tachycardia, chest pain." Patient's past medical history and cardiovascular risk factors include: Anxiety, ADHD, hypertension, hyperlipidemia, family history of premature CAD, history of pericarditis, former smoker. ? ?Patient has had 2 episodes of pericarditis at the age of 53 and 30 according to him. ? ?Family history of premature CAD with father having a myocardial infarction less than 62 years of age and passed away by the age of 52.  And paternal grandfather also had heart disease. ? ?He was originally referred to the office at the request of Fanny Bien, MD for evaluation of tachycardia. ? 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  ? ?Patient was last seen in our office 10/29/2020 at which time coronary CTA was ordered given ongoing precordial pain.  At that visit also started patient on Lopressor 50 mg p.o. twice daily.  Following that visit patient was advised to follow-up in 6 to 8 weeks, unfortunately has been lost to follow-up until now.  Patient did recently have coronary CTA done which showed total coronary calcium score of 0, no evidence of CAD. ? ?He now presents for follow up.  Patient is presently asymptomatic.  He is tolerating Lopressor without issue and palpitations are well controlled.  He does continue to have occasional episodes of tachycardia.  ? ?FUNCTIONAL STATUS: ?No formal exercise routine ? ?ALLERGIES: ?Allergies  ?Allergen Reactions  ? Amoxicillin Other (See  Comments)  ?  Childhood Reaction.  ? Penicillins   ?  Precaution since reaction to amoxicillin  ? Clindamycin/Lincomycin Rash  ? ? ?MEDICATION LIST PRIOR TO VISIT: ?Current Meds  ?Medication Sig  ? albuterol (VENTOLIN HFA) 108 (90 Base) MCG/ACT inhaler Inhale 1-2 puffs into the lungs every 6 (six) hours as needed for wheezing or shortness of breath.  ? cyclobenzaprine (FLEXERIL) 10 MG tablet Take 1 tablet (10 mg total) by mouth 2 (two) times daily as needed for muscle spasms.  ? escitalopram (LEXAPRO) 5 MG tablet Take 5 mg by mouth daily.  ? guanFACINE (INTUNIV) 1 MG TB24 ER tablet Take 1 mg by mouth daily.  ? meclizine (ANTIVERT) 25 MG tablet Take 1 tablet (25 mg total) by mouth 3 (three) times daily as needed for dizziness.  ? metoprolol tartrate (LOPRESSOR) 50 MG tablet Take 1 tablet (50 mg total) by mouth 2 (two) times daily.  ? olmesartan (BENICAR) 40 MG tablet Take 40 mg by mouth daily.  ? rosuvastatin (CRESTOR) 10 MG tablet Take 10 mg by mouth at bedtime.  ? RYBELSUS 3 MG TABS Take 1 tablet by mouth every morning.  ?  ? ?PAST MEDICAL HISTORY: ?Past Medical History:  ?Diagnosis Date  ? ADHD   ? Allergy   ? Anxiety   ? Asthma   ? Hypercholesteremia   ? Hyperlipidemia   ? Hypertension   ? Lipoma   ? Migraine   ? Pericarditis   ? Pericarditis   ? Prediabetes   ? Sinus tachycardia   ? ? ?  PAST SURGICAL HISTORY: ?Past Surgical History:  ?Procedure Laterality Date  ? TONSILLECTOMY  age 71  ? tubes in ears  ? tubes    ? ? ?FAMILY HISTORY: ?The patient family history includes Cancer in his father and paternal grandfather; Coronary artery disease in his father; Diabetes in his father; Heart disease in his father and paternal grandmother; Hyperlipidemia in his father and mother; Hypertension in his father and mother; Migraines in his mother. ? ?SOCIAL HISTORY:  ?The patient  reports that he quit smoking about 14 years ago. His smoking use included cigarettes. He has a 0.25 pack-year smoking history. He has never used  smokeless tobacco. He reports current alcohol use. He reports that he does not use drugs. ? ?REVIEW OF SYSTEMS: ?Review of Systems  ?Constitutional: Negative for chills, diaphoresis and fever.  ?Cardiovascular:  Positive for palpitations (improved). Negative for chest pain, claudication, dyspnea on exertion, leg swelling, near-syncope, orthopnea, paroxysmal nocturnal dyspnea and syncope.  ? ?PHYSICAL EXAM: ? ?  05/27/2021  ?  2:08 PM 05/18/2021  ?  9:19 AM 05/18/2021  ?  8:10 AM  ?Vitals with BMI  ?Height '6\' 1"'$     ?Weight 262 lbs    ?BMI 34.57    ?Systolic 793 903 009  ?Diastolic 76 69 86  ?Pulse 83 62 61  ? ?Orthostatic VS for the past 72 hrs (Last 3 readings): ? Patient Position BP Location Cuff Size  ?05/27/21 1408 Sitting Left Arm Large  ? ?Physical Exam ?Vitals reviewed.  ?HENT:  ?   Head: Normocephalic and atraumatic.  ?Cardiovascular:  ?   Rate and Rhythm: Normal rate and regular rhythm.  ?   Pulses: Intact distal pulses.  ?   Heart sounds: S1 normal and S2 normal. No murmur heard. ?  No gallop.  ?Pulmonary:  ?   Effort: Pulmonary effort is normal. No respiratory distress.  ?   Breath sounds: No wheezing, rhonchi or rales.  ?Musculoskeletal:  ?   Right lower leg: No edema.  ?   Left lower leg: No edema.  ?Neurological:  ?   Mental Status: He is alert.  ?Physical exam unchanged from previous office visit. ? ?CARDIAC DATABASE: ?Coronary CTA 05/18/2021: ?1. No significant incidental noncardiac findings are noted. ?1. Total coronary calcium score of 0. ? 2. Normal coronary origin with right dominance. ? 3. CAD-RADS = 0 No evidence of CAD (0%). ? Left main: Patent. ? LAD: Patent. ? LCx: Patent. ? RCA: Patent. ? RECOMMENDATIONS: Consider non-atherosclerotic causes of chest pain. ? ?7 day extended Holter monitor: ?Patch Wear Time:  5 days and 3 hours  ?Dominant rhythm normal sinus, followed by sinus tachycardia.  ?Heart rate 49-172 bpm.  Avg HR 95 bpm. ?No atrial fibrillation, supraventricular or ventricular  tachycardia, high grade AV block, pauses (3 seconds or longer). ?Total ventricular ectopic burden <1%. ?Total supraventricular ectopic burden <1%. ?Patient triggered events: 5.  Underlying rhythm sinus tachycardia without dysrhythmias. ? ?EKG: ?10/01/2020: Normal sinus rhythm, 87 bpm, LVH per voltage criteria, consider old inferior infarct, without underlying ischemia pattern. ?05/27/2021: Sinus rhythm at a rate of 72 bpm.  LVH per voltage criteria, consider old inferior infarct without underlying ischemia pattern.  Compared EKG 09/27/2020, no significant change. ? ?Echocardiogram: ?10/01/2020 ?Left ventricle cavity is normal in size and wall thickness. Normal global wall motion. Normal LV systolic function with EF 60%. Normal diastolic filling pattern. ?Mild pulmonic regurgitation. ?No evidence of pulmonary hypertension. ?   ?Stress Testing: ?No results found for this or any  previous visit from the past 1095 days. ? ?Heart Catheterization: ?None ? ?LABORATORY DATA: ? ?  Latest Ref Rng & Units 02/26/2015  ?  5:11 PM  ?CBC  ?WBC 4.0 - 10.5 K/uL 16.1    ?Hemoglobin 13.0 - 17.0 g/dL 16.5    ?Hematocrit 39.0 - 52.0 % 46.3    ?Platelets 150 - 400 K/uL 212    ? ? ? ?  Latest Ref Rng & Units 02/26/2015  ?  5:11 PM 04/05/2006  ?  3:26 PM  ?CMP  ?Glucose 65 - 99 mg/dL 108   97    ?BUN 6 - 20 mg/dL 16   12    ?Creatinine 0.61 - 1.24 mg/dL 1.15   1.0    ?Sodium 135 - 145 mmol/L 137   140    ?Potassium 3.5 - 5.1 mmol/L 4.2   3.6    ?Chloride 101 - 111 mmol/L 101   102    ?CO2 22 - 32 mmol/L 23   31    ?Calcium 8.9 - 10.3 mg/dL 9.5   9.9    ? ? ?Lipid Panel  ?No results found for: CHOL, TRIG, HDL, CHOLHDL, VLDL, LDLCALC, LDLDIRECT, LABVLDL ? ?No components found for: NTPROBNP ?No results for input(s): PROBNP in the last 8760 hours. ?No results for input(s): TSH in the last 8760 hours. ? ?BMP ?No results for input(s): NA, K, CL, CO2, GLUCOSE, BUN, CREATININE, CALCIUM, GFRNONAA, GFRAA in the last 8760 hours. ? ?HEMOGLOBIN A1C ?No  results found for: HGBA1C, MPG ? ?External Labs: ?Collected: 09/09/2020 provided by the patient ?White count 10.1, hemoglobin 16.3 g/dL, hematocrit 46.3%, platelets 229 ?Sodium 137, potassium 4.7, chloride 99, bicarb 23, BUN 16, c

## 2021-08-20 DIAGNOSIS — F4312 Post-traumatic stress disorder, chronic: Secondary | ICD-10-CM | POA: Diagnosis not present

## 2021-08-25 DIAGNOSIS — F40298 Other specified phobia: Secondary | ICD-10-CM | POA: Diagnosis not present

## 2021-08-25 DIAGNOSIS — R7303 Prediabetes: Secondary | ICD-10-CM | POA: Diagnosis not present

## 2021-08-25 DIAGNOSIS — J45909 Unspecified asthma, uncomplicated: Secondary | ICD-10-CM | POA: Diagnosis not present

## 2021-09-02 DIAGNOSIS — F4312 Post-traumatic stress disorder, chronic: Secondary | ICD-10-CM | POA: Diagnosis not present

## 2021-09-07 ENCOUNTER — Ambulatory Visit
Admission: EM | Admit: 2021-09-07 | Discharge: 2021-09-07 | Disposition: A | Discharge status: Home / Self Care | Payer: BC Managed Care – PPO | Attending: Physician Assistant | Admitting: Physician Assistant

## 2021-09-07 DIAGNOSIS — R7303 Prediabetes: Secondary | ICD-10-CM | POA: Diagnosis not present

## 2021-09-07 DIAGNOSIS — H60502 Unspecified acute noninfective otitis externa, left ear: Secondary | ICD-10-CM

## 2021-09-07 DIAGNOSIS — M109 Gout, unspecified: Secondary | ICD-10-CM | POA: Diagnosis not present

## 2021-09-07 DIAGNOSIS — F4312 Post-traumatic stress disorder, chronic: Secondary | ICD-10-CM | POA: Diagnosis not present

## 2021-09-07 DIAGNOSIS — E559 Vitamin D deficiency, unspecified: Secondary | ICD-10-CM | POA: Diagnosis not present

## 2021-09-07 DIAGNOSIS — I10 Essential (primary) hypertension: Secondary | ICD-10-CM | POA: Diagnosis not present

## 2021-09-07 MED ORDER — CIPROFLOXACIN-DEXAMETHASONE 0.3-0.1 % OT SUSP: 4.0000 [drp] | Freq: Two times a day (BID) | OTIC | 0 refills | Status: AC

## 2021-09-07 NOTE — ED Provider Notes (Signed)
[facility]    CSN: [id number] Arrival date & time: 09/07/21  1027      History   Chief Complaint Chief Complaint  Patient presents with   Otalgia    HPI Glenn Burton is a 36 y.o. male.   Patient here today for evaluation of left ear pain that he first noticed yesterday.  He reports that he was in the pool when he first noticed more significant pain, and on his way home from [place] yesterday he tried Valsalva maneuver without significant relief.  He has chronic history of bilateral ear issues and is seen by two specialist for the same.  He denies any fever.  He does report that last night while cleaning out his ear with peroxide he noticed a lady bug carcass come from his left ear.  He notes he did have some improvement with hearing for a short amount of time and then symptoms seem to worsen.  The history is provided by the patient.  Otalgia Associated symptoms: no fever     Past Medical History:  Diagnosis Date   ADHD    Allergy    Anxiety    Asthma    Hypercholesteremia    Hyperlipidemia    Hypertension    Lipoma    Migraine    Pericarditis    Pericarditis    Prediabetes    Sinus tachycardia     Patient Active Problem List   Diagnosis Date Noted   Cough 08/29/2020   Migraine headache 01/19/2011    Past Surgical History:  Procedure Laterality Date   TONSILLECTOMY  age 30   tubes in ears   tubes         Home Medications    Prior to Admission medications   Medication Sig Start Date End Date Taking? Authorizing Provider  ciprofloxacin-dexamethasone (CIPRODEX) OTIC suspension Place 4 drops into the left ear 2 (two) times daily for 7 days. 09/07/21 09/14/21 Yes Francene Finders, PA-C  albuterol (VENTOLIN HFA) 108 (90 Base) MCG/ACT inhaler Inhale 1-2 puffs into the lungs every 6 (six) hours as needed for wheezing or shortness of breath. 08/29/20   Wieters, Hallie C, PA-C  cyclobenzaprine (FLEXERIL) 10 MG tablet Take 1 tablet (10 mg total) by mouth  2 (two) times daily as needed for muscle spasms. 02/07/16   Larene Pickett, PA-C  escitalopram (LEXAPRO) 5 MG tablet Take 5 mg by mouth daily. 05/03/21   [provider]  guanFACINE (INTUNIV) 1 MG TB24 ER tablet Take 1 mg by mouth daily. 10/20/20   [provider]  meclizine (ANTIVERT) 25 MG tablet Take 1 tablet (25 mg total) by mouth 3 (three) times daily as needed for dizziness. 10/06/20   Enrique Sack, FNP  metoprolol tartrate (LOPRESSOR) 50 MG tablet Take 1 tablet (50 mg total) by mouth 2 (two) times daily. 10/29/20 05/27/21  Cantwell, Celeste C, PA-C  olmesartan (BENICAR) 40 MG tablet Take 40 mg by mouth daily.    [provider]  rosuvastatin (CRESTOR) 10 MG tablet Take 10 mg by mouth at bedtime. 09/18/20   [provider]  RYBELSUS 3 MG TABS Take 1 tablet by mouth every morning. 05/03/21   [provider]    Family History Family History  Problem Relation Age of Onset   Migraines Mother    Hyperlipidemia Mother    Hypertension Mother    Diabetes Father    Cancer Father    Hypertension Father    Coronary artery disease Father  Heart disease Father    Hyperlipidemia Father    Heart disease Paternal Grandmother    Cancer Paternal Grandfather     Social History Social History   Tobacco Use   Smoking status: Former    Packs/day: 0.25    Years: 1.00    Total pack years: 0.25    Types: Cigarettes    Quit date: 04/26/2007    Years since quitting: 14.3   Smokeless tobacco: Never  Vaping Use   Vaping Use: Former  Substance Use Topics   Alcohol use: Yes    Comment: Occasonal   Drug use: No     Allergies   Amoxicillin, Penicillins, and Clindamycin/lincomycin   Review of Systems Review of Systems  Constitutional:  Negative for chills and fever.  HENT:  Positive for ear pain.   Eyes:  Negative for discharge and redness.  Neurological:  Negative for numbness.     Physical Exam Triage Vital Signs ED Triage Vitals  Enc  Vitals Group     BP 09/07/21 0938 124/80     Pulse Rate 09/07/21 0938 71     Resp 09/07/21 0938 18     Temp 09/07/21 0938 98 F (36.7 C)     Temp Source 09/07/21 0938 Oral     SpO2 09/07/21 0938 97 %     Weight --      Height --      Head Circumference --      Peak Flow --      Pain Score 09/07/21 0937 7     Pain Loc --      Pain Edu? --      Excl. in [place]? --    No data found.  Updated Vital Signs BP 124/80 (BP Location: Left Arm)   Pulse 71   Temp 98 F (36.7 C) (Oral)   Resp 18   SpO2 97%      Physical Exam Vitals and nursing note reviewed.  Constitutional:      General: He is not in acute distress.    Appearance: Normal appearance. He is not ill-appearing.  HENT:     Head: Normocephalic and atraumatic.     Right Ear: Tympanic membrane and ear canal normal.     Ears:     Comments: Left EAC erythematous, granulation tissue noted to left TM Eyes:     Conjunctiva/sclera: Conjunctivae normal.  Cardiovascular:     Rate and Rhythm: Normal rate.  Pulmonary:     Effort: Pulmonary effort is normal.  Neurological:     Mental Status: He is alert.  Psychiatric:        Mood and Affect: Mood normal.        Behavior: Behavior normal.        Thought Content: Thought content normal.      UC Treatments / Results  Labs (all labs ordered are listed, but only abnormal results are displayed) Labs Reviewed - No data to display  EKG   Radiology No results found.  Procedures Procedures (including critical care time)  Medications Ordered in UC Medications - No data to display  Initial Impression / Assessment and Plan / UC Course  I have reviewed the triage vital signs and the nursing notes.  Pertinent labs & imaging results that were available during my care of the patient were reviewed by me and considered in my medical decision making (see chart for details).   Will treat with antibiotic/ steroid ear drops to hopefully improve symptoms. Patient has  follow up with  his PCP later this week. Encouraged follow up with his specialist if symptoms worsen in the meantime.    Final Clinical Impressions(s) / UC Diagnoses   Final diagnoses:  Acute otitis externa of left ear, unspecified type   Discharge Instructions   None    ED Prescriptions     Medication Sig Dispense Auth. Provider   ciprofloxacin-dexamethasone (CIPRODEX) OTIC suspension Place 4 drops into the left ear 2 (two) times daily for 7 days. 7.5 mL Francene Finders, PA-C      PDMP not reviewed this encounter.   Francene Finders, PA-C 09/07/21 1228

## 2021-09-07 NOTE — ED Triage Notes (Signed)
Pt presents with left ear pain for past few days after removing a bug from his ear yesterday.

## 2021-09-11 DIAGNOSIS — H6090 Unspecified otitis externa, unspecified ear: Secondary | ICD-10-CM | POA: Diagnosis not present

## 2021-09-11 DIAGNOSIS — E559 Vitamin D deficiency, unspecified: Secondary | ICD-10-CM | POA: Diagnosis not present

## 2021-09-11 DIAGNOSIS — R7303 Prediabetes: Secondary | ICD-10-CM | POA: Diagnosis not present

## 2021-09-11 DIAGNOSIS — M109 Gout, unspecified: Secondary | ICD-10-CM | POA: Diagnosis not present

## 2021-09-14 ENCOUNTER — Ambulatory Visit
Admission: EM | Admit: 2021-09-14 | Discharge: 2021-09-14 | Disposition: A | Discharge status: Home / Self Care | Payer: BC Managed Care – PPO

## 2021-09-14 ENCOUNTER — Encounter: Payer: Self-pay | Admitting: *Deleted

## 2021-09-14 DIAGNOSIS — H9202 Otalgia, left ear: Secondary | ICD-10-CM

## 2021-09-14 DIAGNOSIS — H6090 Unspecified otitis externa, unspecified ear: Secondary | ICD-10-CM | POA: Diagnosis not present

## 2021-09-14 NOTE — ED Provider Notes (Signed)
[facility]    CSN: [id number] Arrival date & time: 09/14/21  1401      History   Chief Complaint No chief complaint on file.   HPI Glenn Burton is a 36 y.o. male.   Patient presents with persistent left ear pain that has been present for over a week.  Patient was seen in urgent care on 09/07/2021 and prescribed Ciprodex antibiotic drops for left otitis externa.  Patient reported to provider at that time that ear pain started while swimming.  He also reports that he got a ladybug carcass out of his ear the same day.  Ear pain was persistent, and he saw PCP via virtual visit about 5 days later.  He was prescribed ciprofloxacin by mouth in addition to antibiotic eardrops.  They encouraged him to follow-up in person so he saw PCP today and was told that "nothing was wrong with his ear".  He is very concerned today for perforation of eardrum.  He states that whenever he does Valsalva maneuver, he has a whistling sound coming out of his ear.  He denies any drainage or decreased hearing from the ear that is different from baseline.  He does state that he has decreased hearing that started about 2 years ago and is followed by ENT for this.  He made an appointment with his ENT specialty in 3 days for further evaluation and management of this current left ear pain.  Although, he would like a work note given that he works as a Audiological scientist and does not want to be putting a stethoscope in his ears routinely over the next few days until further evaluated by ENT.     Past Medical History:  Diagnosis Date   ADHD    Allergy    Anxiety    Asthma    Hypercholesteremia    Hyperlipidemia    Hypertension    Lipoma    Migraine    Pericarditis    Pericarditis    Prediabetes    Sinus tachycardia     Patient Active Problem List   Diagnosis Date Noted   Cough 08/29/2020   Migraine headache 01/19/2011    Past Surgical History:  Procedure Laterality Date   TONSILLECTOMY  age 79    tubes in ears   tubes         Home Medications    Prior to Admission medications   Medication Sig Start Date End Date Taking? Authorizing Provider  CIPROFLOXACIN HCL PO Take by mouth.   Yes [provider]  ciprofloxacin-dexamethasone (CIPRODEX) OTIC suspension Place 4 drops into the left ear 2 (two) times daily for 7 days. 09/07/21 09/14/21 Yes Francene Finders, PA-C  escitalopram (LEXAPRO) 5 MG tablet Take 5 mg by mouth daily. 05/03/21  Yes [provider]  metoprolol tartrate (LOPRESSOR) 50 MG tablet Take 1 tablet (50 mg total) by mouth 2 (two) times daily. 10/29/20 09/14/21 Yes Cantwell, Celeste C, PA-C  olmesartan (BENICAR) 40 MG tablet Take 40 mg by mouth daily.   Yes [provider]  rosuvastatin (CRESTOR) 10 MG tablet Take 10 mg by mouth at bedtime. 09/18/20  Yes [provider]  albuterol (VENTOLIN HFA) 108 (90 Base) MCG/ACT inhaler Inhale 1-2 puffs into the lungs every 6 (six) hours as needed for wheezing or shortness of breath. 08/29/20   Wieters, Hallie C, PA-C  cyclobenzaprine (FLEXERIL) 10 MG tablet Take 1 tablet (10 mg total) by mouth 2 (two) times daily as needed for muscle spasms. 02/07/16  Larene Pickett, PA-C  guanFACINE (INTUNIV) 1 MG TB24 ER tablet Take 1 mg by mouth daily. 10/20/20   [provider]  meclizine (ANTIVERT) 25 MG tablet Take 1 tablet (25 mg total) by mouth 3 (three) times daily as needed for dizziness. 10/06/20   Enrique Sack, FNP  RYBELSUS 3 MG TABS Take 1 tablet by mouth every morning. 05/03/21   [provider]    Family History Family History  Problem Relation Age of Onset   Migraines Mother    Hyperlipidemia Mother    Hypertension Mother    Diabetes Father    Cancer Father    Hypertension Father    Coronary artery disease Father    Heart disease Father    Hyperlipidemia Father    Heart disease Paternal Grandmother    Cancer Paternal Grandfather     Social History Social History    Tobacco Use   Smoking status: Former    Packs/day: 0.25    Years: 1.00    Total pack years: 0.25    Types: Cigarettes    Quit date: 04/26/2007    Years since quitting: 14.3   Smokeless tobacco: Never  Vaping Use   Vaping Use: Never used  Substance Use Topics   Alcohol use: Yes    Comment: occasional   Drug use: Never     Allergies   Amoxicillin, Penicillins, and Clindamycin/lincomycin   Review of Systems Review of Systems Per HPI  Physical Exam Triage Vital Signs ED Triage Vitals  Enc Vitals Group     BP 09/14/21 1539 (!) 145/94     Pulse Rate 09/14/21 1539 82     Resp 09/14/21 1539 16     Temp 09/14/21 1539 99 F (37.2 C)     Temp Source 09/14/21 1539 Oral     SpO2 09/14/21 1539 97 %     Weight --      Height --      Head Circumference --      Peak Flow --      Pain Score 09/14/21 1541 4     Pain Loc --      Pain Edu? --      Excl. in [place]? --    No data found.  Updated Vital Signs BP (!) 145/94   Pulse 82   Temp 99 F (37.2 C) (Oral)   Resp 16   SpO2 97%   Visual Acuity Right Eye Distance:   Left Eye Distance:   Bilateral Distance:    Right Eye Near:   Left Eye Near:    Bilateral Near:     Physical Exam Constitutional:      General: He is not in acute distress.    Appearance: Normal appearance. He is not toxic-appearing or diaphoretic.  HENT:     Head: Normocephalic and atraumatic.     Left Ear: Ear canal and external ear normal.     Ears:     Comments: TM appears intact.  Very mild erythema noted to TM.  No purulent drainage noted.  External canal appears normal. Eyes:     Extraocular Movements: Extraocular movements intact.     Conjunctiva/sclera: Conjunctivae normal.  Pulmonary:     Effort: Pulmonary effort is normal.  Neurological:     General: No focal deficit present.     Mental Status: He is alert and oriented to person, place, and time. Mental status is at baseline.  Psychiatric:        Mood and  Affect: Mood normal.         Behavior: Behavior normal.        Thought Content: Thought content normal.        Judgment: Judgment normal.      UC Treatments / Results  Labs (all labs ordered are listed, but only abnormal results are displayed) Labs Reviewed - No data to display  EKG   Radiology No results found.  Procedures Procedures (including critical care time)  Medications Ordered in UC Medications - No data to display  Initial Impression / Assessment and Plan / UC Course  I have reviewed the triage vital signs and the nursing notes.  Pertinent labs & imaging results that were available during my care of the patient were reviewed by me and considered in my medical decision making (see chart for details).     Patient advised to continue antibiotics that were prescribed by previous healthcare providers.  No obvious TM perforation on exam but patient is very concerned for this.  Encouraged follow-up with ENT specialty at scheduled appointment in the next 3 days for further evaluation and management.  Will provide patient a work note given that I think this is reasonable.  Patient was given strict return precautions.  Patient verbalized understanding and was agreeable with plan. Final Clinical Impressions(s) / UC Diagnoses   Final diagnoses:  Left ear pain     Discharge Instructions      Please continue medications and follow-up with ear, nose, throat specialist at scheduled appointment.    ED Prescriptions   None    PDMP not reviewed this encounter.   Teodora Medici,  09/14/21 1616

## 2021-09-14 NOTE — ED Triage Notes (Addendum)
Treated for left otitis externa 09/07/21 at [facility] after swimming on vacation; had used H2O2 after not being able to hear, and states he got an insect out of ear Has been using Ciprodex x7 days; f/u with PCP office 3 days ago -- was told to continue Ciprodex and was also placed on an oral abx for perforated TM. Saw PCP today - was told "nothing is wrong with it". States needs a work note to continue to be out of work until he sees ENT on Wed.

## 2021-09-14 NOTE — Discharge Instructions (Signed)
Please continue medications and follow-up with ear, nose, throat specialist at scheduled appointment.

## 2021-09-16 DIAGNOSIS — H9012 Conductive hearing loss, unilateral, left ear, with unrestricted hearing on the contralateral side: Secondary | ICD-10-CM | POA: Diagnosis not present

## 2021-09-16 DIAGNOSIS — H7202 Central perforation of tympanic membrane, left ear: Secondary | ICD-10-CM | POA: Diagnosis not present

## 2021-09-16 DIAGNOSIS — H60332 Swimmer's ear, left ear: Secondary | ICD-10-CM | POA: Diagnosis not present

## 2021-10-05 DIAGNOSIS — F4312 Post-traumatic stress disorder, chronic: Secondary | ICD-10-CM | POA: Diagnosis not present

## 2021-10-09 DIAGNOSIS — F902 Attention-deficit hyperactivity disorder, combined type: Secondary | ICD-10-CM | POA: Diagnosis not present

## 2021-10-09 DIAGNOSIS — F419 Anxiety disorder, unspecified: Secondary | ICD-10-CM | POA: Diagnosis not present

## 2021-10-09 DIAGNOSIS — Z79899 Other long term (current) drug therapy: Secondary | ICD-10-CM | POA: Diagnosis not present

## 2021-10-11 ENCOUNTER — Encounter: Payer: Self-pay | Admitting: Emergency Medicine

## 2021-10-11 ENCOUNTER — Other Ambulatory Visit: Payer: Self-pay

## 2021-10-11 ENCOUNTER — Ambulatory Visit
Admission: EM | Admit: 2021-10-11 | Discharge: 2021-10-11 | Disposition: A | Discharge status: Home / Self Care | Payer: BC Managed Care – PPO | Attending: Physician Assistant | Admitting: Physician Assistant

## 2021-10-11 DIAGNOSIS — H65192 Other acute nonsuppurative otitis media, left ear: Secondary | ICD-10-CM

## 2021-10-11 MED ORDER — DOXYCYCLINE HYCLATE 100 MG PO CAPS: 100.0000 mg | ORAL_CAPSULE | Freq: Two times a day (BID) | ORAL | 0 refills | Status: DC

## 2021-10-11 NOTE — ED Triage Notes (Signed)
Pt here for left ear pain with hx of ear infections in past

## 2021-10-11 NOTE — ED Provider Notes (Signed)
[facility]    CSN: [id number] Arrival date & time: 10/11/21  0955      History   Chief Complaint Chief Complaint  Patient presents with   Ear Fullness    Ear infection. Left over Ciprodex drops are not effective. - Entered by patient    HPI Glenn Burton is a 36 y.o. male.   Patient here today for evaluation of left ear pain that started recently.  He has been having recurrent ear infections and does have follow-up with ENT later this month.  He reports he was using leftover Ciprodex drops without resolution of symptoms.  He has not had fever.  He denies any nausea or vomiting.  He does not report other symptoms.  The history is provided by the patient.  Ear Fullness Pertinent negatives include no abdominal pain and no shortness of breath.    Past Medical History:  Diagnosis Date   ADHD    Allergy    Anxiety    Asthma    Hypercholesteremia    Hyperlipidemia    Hypertension    Lipoma    Migraine    Pericarditis    Pericarditis    Prediabetes    Sinus tachycardia     Patient Active Problem List   Diagnosis Date Noted   Cough 08/29/2020   Migraine headache 01/19/2011    Past Surgical History:  Procedure Laterality Date   TONSILLECTOMY  age 75   tubes in ears   tubes         Home Medications    Prior to Admission medications   Medication Sig Start Date End Date Taking? Authorizing Provider  doxycycline (VIBRAMYCIN) 100 MG capsule Take 1 capsule (100 mg total) by mouth 2 (two) times daily. 10/11/21  Yes Francene Finders, PA-C  albuterol (VENTOLIN HFA) 108 (90 Base) MCG/ACT inhaler Inhale 1-2 puffs into the lungs every 6 (six) hours as needed for wheezing or shortness of breath. 08/29/20   Wieters, Hallie C, PA-C  CIPROFLOXACIN HCL PO Take by mouth.    [provider]  cyclobenzaprine (FLEXERIL) 10 MG tablet Take 1 tablet (10 mg total) by mouth 2 (two) times daily as needed for muscle spasms. 02/07/16   Larene Pickett, PA-C   escitalopram (LEXAPRO) 5 MG tablet Take 5 mg by mouth daily. 05/03/21   [provider]  guanFACINE (INTUNIV) 1 MG TB24 ER tablet Take 1 mg by mouth daily. 10/20/20   [provider]  meclizine (ANTIVERT) 25 MG tablet Take 1 tablet (25 mg total) by mouth 3 (three) times daily as needed for dizziness. 10/06/20   Enrique Sack, FNP  metoprolol tartrate (LOPRESSOR) 50 MG tablet Take 1 tablet (50 mg total) by mouth 2 (two) times daily. 10/29/20 09/14/21  Cantwell, Celeste C, PA-C  olmesartan (BENICAR) 40 MG tablet Take 40 mg by mouth daily.    [provider]  rosuvastatin (CRESTOR) 10 MG tablet Take 10 mg by mouth at bedtime. 09/18/20   [provider]  RYBELSUS 3 MG TABS Take 1 tablet by mouth every morning. 05/03/21   [provider]    Family History Family History  Problem Relation Age of Onset   Migraines Mother    Hyperlipidemia Mother    Hypertension Mother    Diabetes Father    Cancer Father    Hypertension Father    Coronary artery disease Father    Heart disease Father    Hyperlipidemia Father    Heart disease Paternal Grandmother  Cancer Paternal Grandfather     Social History Social History   Tobacco Use   Smoking status: Former    Packs/day: 0.25    Years: 1.00    Total pack years: 0.25    Types: Cigarettes    Quit date: 04/26/2007    Years since quitting: 14.4   Smokeless tobacco: Never  Vaping Use   Vaping Use: Never used  Substance Use Topics   Alcohol use: Yes    Comment: occasional   Drug use: Never     Allergies   Amoxicillin, Penicillins, and Clindamycin/lincomycin   Review of Systems Review of Systems  Constitutional:  Negative for chills and fever.  HENT:  Positive for ear pain. Negative for congestion and sore throat.   Eyes:  Negative for discharge and redness.  Respiratory:  Negative for cough and shortness of breath.   Gastrointestinal:  Negative for abdominal pain, nausea and vomiting.   Neurological:  Negative for numbness.     Physical Exam Triage Vital Signs ED Triage Vitals [10/11/21 1031]  Enc Vitals Group     BP 122/77     Pulse Rate 90     Resp 18     Temp 98.6 F (37 C)     Temp Source Oral     SpO2 97 %     Weight      Height      Head Circumference      Peak Flow      Pain Score 5     Pain Loc      Pain Edu?      Excl. in [place]?    No data found.  Updated Vital Signs BP 122/77 (BP Location: Left Arm)   Pulse 90   Temp 98.6 F (37 C) (Oral)   Resp 18   SpO2 97%      Physical Exam Vitals and nursing note reviewed.  Constitutional:      General: He is not in acute distress.    Appearance: Normal appearance. He is not ill-appearing.  HENT:     Head: Normocephalic and atraumatic.     Right Ear: Tympanic membrane, ear canal and external ear normal. There is no impacted cerumen.     Left Ear: Ear canal and external ear normal. There is no impacted cerumen. Tympanic membrane is injected and erythematous.     Ears:      Comments: Questionable cholesteatoma to left TM    Nose: No congestion or rhinorrhea.  Eyes:     Conjunctiva/sclera: Conjunctivae normal.  Cardiovascular:     Rate and Rhythm: Normal rate.  Pulmonary:     Effort: Pulmonary effort is normal.  Neurological:     Mental Status: He is alert.  Psychiatric:        Mood and Affect: Mood normal.        Behavior: Behavior normal.        Thought Content: Thought content normal.      UC Treatments / Results  Labs (all labs ordered are listed, but only abnormal results are displayed) Labs Reviewed - No data to display  EKG   Radiology No results found.  Procedures Procedures (including critical care time)  Medications Ordered in UC Medications - No data to display  Initial Impression / Assessment and Plan / UC Course  I have reviewed the triage vital signs and the nursing notes.  Pertinent labs & imaging results that were available during my care of the patient  were reviewed  by me and considered in my medical decision making (see chart for details).    Doxycycline prescribed to cover possible otitis media.  Recommended patient keep his appointment with ENT as scheduled or follow-up here sooner with any further concerns.  Final Clinical Impressions(s) / UC Diagnoses   Final diagnoses:  Other acute nonsuppurative otitis media of left ear, recurrence not specified   Discharge Instructions   None    ED Prescriptions     Medication Sig Dispense Auth. Provider   doxycycline (VIBRAMYCIN) 100 MG capsule Take 1 capsule (100 mg total) by mouth 2 (two) times daily. 20 capsule Francene Finders, PA-C      PDMP not reviewed this encounter.   Francene Finders, PA-C 10/11/21 1051

## 2021-10-13 ENCOUNTER — Ambulatory Visit (HOSPITAL_COMMUNITY)
Admission: EM | Admit: 2021-10-13 | Discharge: 2021-10-13 | Disposition: A | Discharge status: Home / Self Care | Payer: BC Managed Care – PPO | Attending: Family Medicine | Admitting: Family Medicine

## 2021-10-13 ENCOUNTER — Encounter (HOSPITAL_COMMUNITY): Payer: Self-pay | Admitting: Emergency Medicine

## 2021-10-13 DIAGNOSIS — H6692 Otitis media, unspecified, left ear: Secondary | ICD-10-CM | POA: Diagnosis not present

## 2021-10-13 DIAGNOSIS — H9202 Otalgia, left ear: Secondary | ICD-10-CM | POA: Diagnosis not present

## 2021-10-13 DIAGNOSIS — H6690 Otitis media, unspecified, unspecified ear: Secondary | ICD-10-CM

## 2021-10-13 MED ORDER — CEFTRIAXONE SODIUM 1 G IJ SOLR
INTRAMUSCULAR | Status: AC
  Filled 2021-10-13: qty 10

## 2021-10-13 MED ORDER — LIDOCAINE HCL (PF) 1 % IJ SOLN
INTRAMUSCULAR | Status: AC
  Filled 2021-10-13: qty 2

## 2021-10-13 MED ORDER — HYDROCODONE-ACETAMINOPHEN 5-325 MG PO TABS: 1.0000 | ORAL_TABLET | Freq: Four times a day (QID) | ORAL | 0 refills | Status: DC | PRN

## 2021-10-13 MED ORDER — CIPROFLOXACIN HCL 750 MG PO TABS: 750.0000 mg | ORAL_TABLET | Freq: Two times a day (BID) | ORAL | 0 refills | Status: DC

## 2021-10-13 MED ORDER — CEFTRIAXONE SODIUM 1 G IJ SOLR
1.0000 g | Freq: Once | INTRAMUSCULAR | Status: AC
  Administered 2021-10-13: 1 g via INTRAMUSCULAR

## 2021-10-13 NOTE — Discharge Instructions (Signed)

## 2021-10-13 NOTE — ED Triage Notes (Signed)
Pt reports left ear pain. States he has recurrent ear infections. Used Ciprodex drops which caused an episode of vertigo today.  Currently taking 100 mg doxycycline for the ear infection.

## 2021-10-14 DIAGNOSIS — F902 Attention-deficit hyperactivity disorder, combined type: Secondary | ICD-10-CM | POA: Diagnosis not present

## 2021-10-14 DIAGNOSIS — Z79899 Other long term (current) drug therapy: Secondary | ICD-10-CM | POA: Diagnosis not present

## 2021-10-14 NOTE — ED Provider Notes (Signed)
[facility]   [id number] 10/13/21 Arrival Time: 6295  ASSESSMENT & PLAN:  1. Otalgia of left ear   2. Acute otitis media, unspecified otitis media type    Orders Placed This Encounter  Procedures   Ambulatory referral to ENT    Referral Priority:   Urgent    Referral Type:   Consultation    Referral Reason:   Specialty Services Required    Requested Specialty:   Otolaryngology    Number of Visits Requested:   1  Question forming cholesteatoma +/- OM. No signs of fungal infection.  Meds ordered this encounter  Medications   ciprofloxacin (CIPRO) 750 MG tablet    Sig: Take 1 tablet (750 mg total) by mouth 2 (two) times daily.    Dispense:  20 tablet    Refill:  0   HYDROcodone-acetaminophen (NORCO/VICODIN) 5-325 MG tablet    Sig: Take 1 tablet by mouth every 6 (six) hours as needed for moderate pain or severe pain.    Dispense:  8 tablet    Refill:  0   cefTRIAXone (ROCEPHIN) injection 1 g   [place] Controlled Substances Registry consulted for this patient. I feel the risk/benefit ratio today is favorable for proceeding with this prescription for a controlled substance. Medication sedation precautions given.  Reviewed expectations re: course of current medical issues. Questions answered. Outlined signs and symptoms indicating need for more acute intervention. Patient verbalized understanding. After Visit Summary given.   SUBJECTIVE: History from: patient.  Glenn Burton is a 36 y.o. male who presents with complaint of persistent L otalgia; without drainage/bleeding. Has been on doxycycline for sev days without any relief. H/O TM rupture approx 1 mo ago. Sees ENT but unable to get appt until October. Afebrile.  Social History   Tobacco Use  Smoking Status Former   Packs/day: 0.25   Years: 1.00   Total pack years: 0.25   Types: Cigarettes   Quit date: 04/26/2007   Years since quitting: 14.4  Smokeless Tobacco Never   OBJECTIVE:  Vitals:   10/13/21 1539   BP: (!) 141/90  Pulse: 83  Resp: 17  Temp: 98.1 F (36.7 C)  TempSrc: Oral  SpO2: 97%    General appearance: alert; appears fatigued Ear Canal: normal TM: left: mid to superior erythema with white, loculated-appearing growth of inferior 1/3 of TM; no bleeding Neck: supple without LAD Lungs: unlabored respirations, symmetrical air entry; cough: absent; no respiratory distress Skin: warm and dry Psychological: alert and cooperative; normal mood and affect  Allergies  Allergen Reactions   Amoxicillin Other (See Comments)    Childhood Reaction.   Penicillins     Precaution since reaction to amoxicillin   Clindamycin/Lincomycin Rash    Past Medical History:  Diagnosis Date   ADHD    Allergy    Anxiety    Asthma    Hypercholesteremia    Hyperlipidemia    Hypertension    Lipoma    Migraine    Pericarditis    Pericarditis    Prediabetes    Sinus tachycardia    Family History  Problem Relation Age of Onset   Migraines Mother    Hyperlipidemia Mother    Hypertension Mother    Diabetes Father    Cancer Father    Hypertension Father    Coronary artery disease Father    Heart disease Father    Hyperlipidemia Father    Heart disease Paternal Grandmother    Cancer Paternal Grandfather    Social  History   Socioeconomic History   Marital status: Married    Spouse name: Not on file   Number of children: Not on file   Years of education: Not on file   Highest education level: Not on file  Occupational History   Not on file  Tobacco Use   Smoking status: Former    Packs/day: 0.25    Years: 1.00    Total pack years: 0.25    Types: Cigarettes    Quit date: 04/26/2007    Years since quitting: 14.4   Smokeless tobacco: Never  Vaping Use   Vaping Use: Never used  Substance and Sexual Activity   Alcohol use: Yes    Comment: occasional   Drug use: Never   Sexual activity: Not on file  Other Topics Concern   Not on file  Social History Narrative   ** Merged  History Encounter **       Social Determinants of Health   Financial Resource Strain: Not on file  Food Insecurity: Not on file  Transportation Needs: Not on file  Physical Activity: Not on file  Stress: Not on file  Social Connections: Not on file  Intimate Partner Violence: Not on file             [place], MD 10/14/21 [phone]

## 2021-10-16 DIAGNOSIS — H6692 Otitis media, unspecified, left ear: Secondary | ICD-10-CM | POA: Diagnosis not present

## 2021-10-23 DIAGNOSIS — H6690 Otitis media, unspecified, unspecified ear: Secondary | ICD-10-CM | POA: Diagnosis not present

## 2021-10-28 DIAGNOSIS — Z23 Encounter for immunization: Secondary | ICD-10-CM | POA: Diagnosis not present

## 2021-10-29 DIAGNOSIS — F4312 Post-traumatic stress disorder, chronic: Secondary | ICD-10-CM | POA: Diagnosis not present

## 2021-11-06 DIAGNOSIS — H9042 Sensorineural hearing loss, unilateral, left ear, with unrestricted hearing on the contralateral side: Secondary | ICD-10-CM | POA: Diagnosis not present

## 2021-11-06 DIAGNOSIS — H6693 Otitis media, unspecified, bilateral: Secondary | ICD-10-CM | POA: Diagnosis not present

## 2021-12-08 DIAGNOSIS — R0981 Nasal congestion: Secondary | ICD-10-CM

## 2021-12-08 HISTORY — DX: Nasal congestion: R09.81

## 2021-12-09 DIAGNOSIS — F4312 Post-traumatic stress disorder, chronic: Secondary | ICD-10-CM | POA: Diagnosis not present

## 2021-12-18 DIAGNOSIS — F4312 Post-traumatic stress disorder, chronic: Secondary | ICD-10-CM | POA: Diagnosis not present

## 2021-12-20 DIAGNOSIS — F4312 Post-traumatic stress disorder, chronic: Secondary | ICD-10-CM | POA: Diagnosis not present

## 2021-12-21 DIAGNOSIS — F4312 Post-traumatic stress disorder, chronic: Secondary | ICD-10-CM | POA: Diagnosis not present

## 2021-12-30 DIAGNOSIS — J209 Acute bronchitis, unspecified: Secondary | ICD-10-CM | POA: Diagnosis not present

## 2022-01-02 DIAGNOSIS — R5383 Other fatigue: Secondary | ICD-10-CM | POA: Insufficient documentation

## 2022-01-02 DIAGNOSIS — R509 Fever, unspecified: Secondary | ICD-10-CM

## 2022-01-02 HISTORY — DX: Other fatigue: R53.83

## 2022-01-02 HISTORY — DX: Fever, unspecified: R50.9

## 2022-01-03 ENCOUNTER — Emergency Department (HOSPITAL_BASED_OUTPATIENT_CLINIC_OR_DEPARTMENT_OTHER): Payer: BC Managed Care – PPO

## 2022-01-03 ENCOUNTER — Other Ambulatory Visit: Payer: Self-pay

## 2022-01-03 ENCOUNTER — Encounter (HOSPITAL_BASED_OUTPATIENT_CLINIC_OR_DEPARTMENT_OTHER): Payer: Self-pay | Admitting: Emergency Medicine

## 2022-01-03 ENCOUNTER — Emergency Department (HOSPITAL_BASED_OUTPATIENT_CLINIC_OR_DEPARTMENT_OTHER): Payer: BC Managed Care – PPO | Admitting: Radiology

## 2022-01-03 ENCOUNTER — Emergency Department (HOSPITAL_BASED_OUTPATIENT_CLINIC_OR_DEPARTMENT_OTHER)
Admission: EM | Admit: 2022-01-03 | Discharge: 2022-01-03 | Disposition: A | Discharge status: Home / Self Care | Payer: BC Managed Care – PPO | Attending: Emergency Medicine | Admitting: Emergency Medicine

## 2022-01-03 DIAGNOSIS — J069 Acute upper respiratory infection, unspecified: Secondary | ICD-10-CM | POA: Diagnosis not present

## 2022-01-03 DIAGNOSIS — Z79899 Other long term (current) drug therapy: Secondary | ICD-10-CM | POA: Diagnosis not present

## 2022-01-03 DIAGNOSIS — J45909 Unspecified asthma, uncomplicated: Secondary | ICD-10-CM | POA: Insufficient documentation

## 2022-01-03 DIAGNOSIS — Z7951 Long term (current) use of inhaled steroids: Secondary | ICD-10-CM | POA: Insufficient documentation

## 2022-01-03 DIAGNOSIS — I10 Essential (primary) hypertension: Secondary | ICD-10-CM | POA: Insufficient documentation

## 2022-01-03 DIAGNOSIS — B9689 Other specified bacterial agents as the cause of diseases classified elsewhere: Secondary | ICD-10-CM

## 2022-01-03 DIAGNOSIS — U071 COVID-19: Secondary | ICD-10-CM | POA: Insufficient documentation

## 2022-01-03 DIAGNOSIS — R059 Cough, unspecified: Secondary | ICD-10-CM | POA: Diagnosis not present

## 2022-01-03 DIAGNOSIS — J019 Acute sinusitis, unspecified: Secondary | ICD-10-CM | POA: Diagnosis not present

## 2022-01-03 DIAGNOSIS — R509 Fever, unspecified: Secondary | ICD-10-CM | POA: Diagnosis not present

## 2022-01-03 HISTORY — DX: COVID-19: U07.1

## 2022-01-03 LAB — CBC WITH DIFFERENTIAL/PLATELET
Abs Immature Granulocytes: 0.03 10*3/uL (ref 0.00–0.07)
Basophils Absolute: 0.1 10*3/uL (ref 0.0–0.1)
Basophils Relative: 1 %
Eosinophils Absolute: 0.3 10*3/uL (ref 0.0–0.5)
Eosinophils Relative: 3 %
HCT: 46.3 % (ref 39.0–52.0)
Hemoglobin: 16 g/dL (ref 13.0–17.0)
Immature Granulocytes: 0 %
Lymphocytes Relative: 15 %
Lymphs Abs: 1.5 10*3/uL (ref 0.7–4.0)
MCH: 30.9 pg (ref 26.0–34.0)
MCHC: 34.6 g/dL (ref 30.0–36.0)
MCV: 89.6 fL (ref 80.0–100.0)
Monocytes Absolute: 1.5 10*3/uL — ABNORMAL HIGH (ref 0.1–1.0)
Monocytes Relative: 16 %
Neutro Abs: 6.6 10*3/uL (ref 1.7–7.7)
Neutrophils Relative %: 65 %
Platelets: 208 10*3/uL (ref 150–400)
RBC: 5.17 MIL/uL (ref 4.22–5.81)
RDW: 12.5 % (ref 11.5–15.5)
WBC: 9.9 10*3/uL (ref 4.0–10.5)
nRBC: 0 % (ref 0.0–0.2)

## 2022-01-03 LAB — BASIC METABOLIC PANEL
Anion gap: 8 (ref 5–15)
BUN: 13 mg/dL (ref 6–20)
CO2: 29 mmol/L (ref 22–32)
Calcium: 10 mg/dL (ref 8.9–10.3)
Chloride: 99 mmol/L (ref 98–111)
Creatinine, Ser: 0.9 mg/dL (ref 0.61–1.24)
GFR, Estimated: >60 mL/min (ref >60)
Glucose, Bld: 100 mg/dL — ABNORMAL HIGH (ref 70–99)
Potassium: 4 mmol/L (ref 3.5–5.1)
Sodium: 136 mmol/L (ref 135–145)

## 2022-01-03 LAB — RESP PANEL BY RT-PCR (FLU A&B, COVID) ARPGX2
Influenza A by PCR: NEGATIVE
Influenza B by PCR: NEGATIVE
SARS Coronavirus 2 by RT PCR: POSITIVE — AB

## 2022-01-03 LAB — PROTIME-INR
INR: 1 (ref 0.8–1.2)
Prothrombin Time: 12.6 seconds (ref 11.4–15.2)

## 2022-01-03 LAB — MAGNESIUM: Magnesium: 1.9 mg/dL (ref 1.7–2.4)

## 2022-01-03 MED ORDER — AMOXICILLIN-POT CLAVULANATE 875-125 MG PO TABS: 1.0000 | ORAL_TABLET | Freq: Two times a day (BID) | ORAL | 0 refills | Status: DC

## 2022-01-03 MED ORDER — AMOXICILLIN-POT CLAVULANATE 875-125 MG PO TABS
1.0000 | ORAL_TABLET | Freq: Once | ORAL | Status: AC
  Administered 2022-01-03: 1 via ORAL
  Filled 2022-01-03: qty 1

## 2022-01-03 MED ORDER — SALINE SPRAY 0.65 % NA SOLN: 1.0000 | NASAL | 0 refills | Status: DC | PRN

## 2022-01-03 MED ORDER — ALBUTEROL SULFATE HFA 108 (90 BASE) MCG/ACT IN AERS
INHALATION_SPRAY | RESPIRATORY_TRACT | Status: AC
  Administered 2022-01-03: 2 via RESPIRATORY_TRACT
  Filled 2022-01-03: qty 6.7

## 2022-01-03 MED ORDER — IOHEXOL 350 MG/ML SOLN
100.0000 mL | Freq: Once | INTRAVENOUS | Status: AC | PRN
  Administered 2022-01-03: 80 mL via INTRAVENOUS

## 2022-01-03 MED ORDER — BENZONATATE 100 MG PO CAPS: 100.0000 mg | ORAL_CAPSULE | Freq: Three times a day (TID) | ORAL | 0 refills | Status: DC

## 2022-01-03 MED ORDER — ACETAMINOPHEN 500 MG PO TABS
1000.0000 mg | ORAL_TABLET | Freq: Once | ORAL | Status: AC
  Administered 2022-01-03: 1000 mg via ORAL
  Filled 2022-01-03: qty 2

## 2022-01-03 MED ORDER — ALBUTEROL SULFATE HFA 108 (90 BASE) MCG/ACT IN AERS: 2.0000 | INHALATION_SPRAY | RESPIRATORY_TRACT | Status: DC | PRN

## 2022-01-03 NOTE — ED Triage Notes (Signed)
Pt has been on z pack , but his fever,cough are worse, he is on his last day of antibiotic. Fever stays around 100 even with 1000 mg tylenol

## 2022-01-03 NOTE — ED Provider Notes (Addendum)
[facility] Provider Note   CSN: [id number] Arrival date & time: 01/03/22  7371     History  Chief Complaint  Patient presents with   URI    Glenn Burton is a 36 y.o. male.   URI    36 year old male past medical history significant for pericarditis, asthma, migraine headaches, HTN, anxiety, HLD, ADHD atrial tachycardia on metoprolol presenting to the emergency department with multiple complaints.  The patient states that he has had roughly 4 weeks symptoms of sinusitis.  He been prescribed a Z-Pak but has developed worsening symptoms.  He continues to endorse sinus pressure and pain.  He developed a fever yesterday.  He endorses a dry cough and mild shortness of breath.  He denies any lower extremity swelling.  He endorses occasional sharp chest discomfort.  He is on his last day of azithromycin.  He has been taking Tylenol and still feels febrile.  Home Medications Prior to Admission medications   Medication Sig Start Date End Date Taking? Authorizing Provider  amoxicillin-clavulanate (AUGMENTIN) 875-125 MG tablet Take 1 tablet by mouth every 12 (twelve) hours. 01/03/22  Yes Regan Lemming, MD  sodium chloride (OCEAN) 0.65 % SOLN nasal spray Place 1 spray into both nostrils as needed for congestion. 01/03/22  Yes Regan Lemming, MD  albuterol (VENTOLIN HFA) 108 (90 Base) MCG/ACT inhaler Inhale 1-2 puffs into the lungs every 6 (six) hours as needed for wheezing or shortness of breath. 08/29/20   Wieters, Hallie C, PA-C  ciprofloxacin (CIPRO) 750 MG tablet Take 1 tablet (750 mg total) by mouth 2 (two) times daily. 10/13/21   Vanessa Kick, MD  cyclobenzaprine (FLEXERIL) 10 MG tablet Take 1 tablet (10 mg total) by mouth 2 (two) times daily as needed for muscle spasms. 02/07/16   Larene Pickett, PA-C  doxycycline (VIBRAMYCIN) 100 MG capsule Take 1 capsule (100 mg total) by mouth 2 (two) times daily. 10/11/21   Francene Finders, PA-C  escitalopram (LEXAPRO) 5  MG tablet Take 5 mg by mouth daily. 05/03/21   [provider]  guanFACINE (INTUNIV) 1 MG TB24 ER tablet Take 1 mg by mouth daily. 10/20/20   [provider]  HYDROcodone-acetaminophen (NORCO/VICODIN) 5-325 MG tablet Take 1 tablet by mouth every 6 (six) hours as needed for moderate pain or severe pain. 10/13/21   Vanessa Kick, MD  meclizine (ANTIVERT) 25 MG tablet Take 1 tablet (25 mg total) by mouth 3 (three) times daily as needed for dizziness. 10/06/20   Enrique Sack, FNP  metoprolol tartrate (LOPRESSOR) 50 MG tablet Take 1 tablet (50 mg total) by mouth 2 (two) times daily. 10/29/20 09/14/21  Cantwell, Celeste C, PA-C  olmesartan (BENICAR) 40 MG tablet Take 40 mg by mouth daily.    [provider]  rosuvastatin (CRESTOR) 10 MG tablet Take 10 mg by mouth at bedtime. 09/18/20   [provider]  RYBELSUS 3 MG TABS Take 1 tablet by mouth every morning. 05/03/21   [provider]      Allergies    Clindamycin/lincomycin    Review of Systems   Review of Systems  All other systems reviewed and are negative.   Physical Exam Updated Vital Signs BP 127/81 (BP Location: Left Arm)   Pulse 96   Temp 98.4 F (36.9 C) (Oral)   Resp 16   SpO2 100%  Physical Exam Vitals and nursing note reviewed.  Constitutional:      General: He is not in acute distress.  Appearance: He is well-developed.  HENT:     Head: Normocephalic and atraumatic.     Comments: Bilateral maxillary sinus tenderness to palpation Eyes:     Conjunctiva/sclera: Conjunctivae normal.  Cardiovascular:     Rate and Rhythm: Normal rate and regular rhythm.     Heart sounds: No murmur heard. Pulmonary:     Effort: Pulmonary effort is normal. No respiratory distress.     Breath sounds: Wheezing present.     Comments: Faint expiratory wheezes present Abdominal:     Palpations: Abdomen is soft.     Tenderness: There is no abdominal tenderness.  Musculoskeletal:        General: No  swelling.     Cervical back: Neck supple.  Skin:    General: Skin is warm and dry.     Capillary Refill: Capillary refill takes less than 2 seconds.  Neurological:     Mental Status: He is alert.  Psychiatric:        Mood and Affect: Mood normal.     ED Results / Procedures / Treatments   Labs (all labs ordered are listed, but only abnormal results are displayed) Labs Reviewed  RESP PANEL BY RT-PCR (FLU A&B, COVID) ARPGX2 - Abnormal; Notable for the following components:      Result Value   SARS Coronavirus 2 by RT PCR POSITIVE (*)    All other components within normal limits  CBC WITH DIFFERENTIAL/PLATELET - Abnormal; Notable for the following components:   Monocytes Absolute 1.5 (*)    All other components within normal limits  BASIC METABOLIC PANEL - Abnormal; Notable for the following components:   Glucose, Bld 100 (*)    All other components within normal limits  PROTIME-INR  MAGNESIUM    EKG None  Radiology CT Angio Chest PE W and/or Wo Contrast  Result Date: 01/03/2022 CLINICAL DATA:  Fever and cough. EXAM: CT ANGIOGRAPHY CHEST WITH CONTRAST TECHNIQUE: Multidetector CT imaging of the chest was performed using the standard protocol during bolus administration of intravenous contrast. Multiplanar CT image reconstructions and MIPs were obtained to evaluate the vascular anatomy. RADIATION DOSE REDUCTION: This exam was performed according to the departmental dose-optimization program which includes automated exposure control, adjustment of the mA and/or kV according to patient size and/or use of iterative reconstruction technique. CONTRAST:  6m OMNIPAQUE IOHEXOL 350 MG/ML SOLN COMPARISON:  None Available. FINDINGS: Cardiovascular: The heart is normal in size. No pericardial effusion. The aorta is normal in caliber. No dissection. The pulmonary arterial tree is suboptimally opacified but no definite filling defects to suggest pulmonary embolism. Mediastinum/Nodes: No  mediastinal or hilar mass or lymphadenopathy. The esophagus is grossly normal. Lungs/Pleura: No acute pulmonary findings. No pulmonary lesions or pleural effusion. The tracheobronchial tree is unremarkable. Upper Abdomen: No significant upper abdominal findings. Musculoskeletal: No significant bony findings. Review of the MIP images confirms the above findings. IMPRESSION: 1. No CT findings for pulmonary embolism. 2. Normal thoracic aorta. 3. No acute pulmonary findings. Electronically Signed   By: PMarijo SanesM.D.   On: 01/03/2022 12:41   DG Chest Portable 1 View  Result Date: 01/03/2022 CLINICAL DATA:  Cough EXAM: PORTABLE CHEST 1 VIEW COMPARISON:  08/29/2020 radiograph FINDINGS: The cardiomediastinal silhouette is unremarkable. There is no evidence of focal airspace disease, pulmonary edema, suspicious pulmonary nodule/mass, pleural effusion, or pneumothorax. No acute bony abnormalities are identified. IMPRESSION: No active disease. Electronically Signed   By: JMargarette CanadaM.D.   On: 01/03/2022 10:16    Procedures  Procedures    Medications Ordered in ED Medications  albuterol (VENTOLIN HFA) 108 (90 Base) MCG/ACT inhaler 2 puff (2 puffs Inhalation Given 01/03/22 1020)  amoxicillin-clavulanate (AUGMENTIN) 875-125 MG per tablet 1 tablet (1 tablet Oral Given 01/03/22 1124)  acetaminophen (TYLENOL) tablet 1,000 mg (1,000 mg Oral Given 01/03/22 1124)  iohexol (OMNIPAQUE) 350 MG/ML injection 100 mL (80 mLs Intravenous Contrast Given 01/03/22 1228)    ED Course/ Medical Decision Making/ A&P Clinical Course as of 01/03/22 1309  Sun Jan 03, 2022  0948 SARS Coronavirus 2 by RT PCR(!): POSITIVE [JL]    Clinical Course User Index [JL] Regan Lemming, MD                           Medical Decision Making Amount and/or Complexity of Data Reviewed Labs: ordered. Decision-making details documented in ED Course. Radiology: ordered.  Risk OTC drugs. Prescription drug management.    36 year old  male past medical history significant for pericarditis, asthma, migraine headaches, HTN, anxiety, HLD, ADHD atrial tachycardia on metoprolol presenting to the emergency department with multiple complaints.  The patient states that he has had roughly 4 weeks symptoms of sinusitis.  He been prescribed a Z-Pak but has developed worsening symptoms.  He continues to endorse sinus pressure and pain.  He developed a fever yesterday.  He endorses a dry cough and mild shortness of breath.  He denies any lower extremity swelling.  He endorses occasional sharp chest discomfort.  He is on his last day of azithromycin.  He has been taking Tylenol and still feels febrile.  On arrival, the patient was afebrile, borderline tachycardic (to take his metoprolol today) pulse 99, RR 20, BP 124/80, saturating 93 to 100% on room air.  Pulse oximetry while ambulating was performed which resulted in no desaturations.  The patient did test positive for COVID-19 via PCR testing.  He also has evidence of bacterial sinusitis as his symptoms have been present for the past 4 weeks.  The laboratory evaluation was significant for CBC without leukocytosis or anemia, magnesium normal, BMP unremarkable, chest x-ray was performed revealed no focal cardiac or pulm abnormality.  Discussed the risk and benefits of CT imaging with the patient to evaluate for PE in the setting of the patient's COVID-19 diagnosis.    CTA PE imaging resulted as follows: IMPRESSION:  1. No CT findings for pulmonary embolism.  2. Normal thoracic aorta.  3. No acute pulmonary findings.    Favor likely acute bacterial sinusitis for which we will treat with a course of Augmentin.  Of note, the patient has a remote childhood allergy associated with amoxicillin.  The patient tolerated dose of Augmentin in the emergency department with no signs of allergic reaction.  This allergy was removed from his chart.  Additionally advised nasal saline sinus rinses.  Given the  patient's positive COVID-19 PCR testing, fever, cough, symptoms are consistent with likely acute COVID-19 infection.  Unclear duration of illness.  The patient is overall well-appearing.  Do not feel that Paxlovid is indicated at this time given the unclear duration of illness and lack of comorbidities.  Provided instructions for symptomatic management at home.  Stable for PCP outpatient follow-up.   Final Clinical Impression(s) / ED Diagnoses Final diagnoses:  Acute bacterial sinusitis  COVID-19    Rx / DC Orders ED Discharge Orders          Ordered    amoxicillin-clavulanate (AUGMENTIN) 875-125 MG tablet  Every 12  hours        01/03/22 1251    sodium chloride (OCEAN) 0.65 % SOLN nasal spray  As needed        01/03/22 1252              Regan Lemming, MD 01/03/22 1309    Regan Lemming, MD 01/03/22 1310

## 2022-01-03 NOTE — ED Notes (Signed)
Patient transported to CT 

## 2022-01-03 NOTE — Discharge Instructions (Addendum)
Your CTA revealed no evidence of pneumonia, ruptured lung, fluid layering in your chest.  There is no evidence of blood clot in your lungs.  Your symptoms are consistent with likely bacterial sinusitis and potentially new onset COVID-19.  Recommend isolation at home for at least the next 4 days, treat bacterial sinusitis with Augmentin.  You have tolerated a dose here in the emergency department.  Can also perform sinus rinses with saline.  Recommend PCP follow-up to ensure resolution.

## 2022-01-03 NOTE — Consult Note (Signed)
RT note: Ambulating pulse oximetry reading was obtained previously at 0955 while pt. was moved from Respiratory Isolation waiting area to pt. isolation room with results as follows: Starting: HR-84 / Sat-98% Finishing: HR-87 / Sat-99%

## 2022-01-03 NOTE — ED Notes (Signed)
RT note: Pt. seen, assessed per Asthma Wheeze  Protocol, RT to follow.

## 2022-01-05 ENCOUNTER — Telehealth: Payer: BC Managed Care – PPO | Admitting: Family Medicine

## 2022-01-05 DIAGNOSIS — U071 COVID-19: Secondary | ICD-10-CM

## 2022-01-05 DIAGNOSIS — R430 Anosmia: Secondary | ICD-10-CM

## 2022-01-05 HISTORY — DX: Anosmia: R43.0

## 2022-01-05 MED ORDER — NIRMATRELVIR/RITONAVIR (PAXLOVID)TABLET: 3.0000 | ORAL_TABLET | Freq: Two times a day (BID) | ORAL | 0 refills | Status: AC

## 2022-01-05 NOTE — Patient Instructions (Signed)
Glenn Burton, thank you for joining [name], NP for today's virtual visit.  While this provider is not your primary care provider (PCP), if your PCP is located in our provider database this encounter information will be shared with them immediately following your visit.   [facility] account gives you access to today's visit and all your visits, tests, and labs performed at [facility] " click here if you don't have a [facility] account or go to [email or web address]  Consent: (Patient) Glenn Burton provided verbal consent for this virtual visit at the beginning of the encounter.  Current Medications:  Current Outpatient Medications:    nirmatrelvir/ritonavir EUA (PAXLOVID) 20 x 150 MG & 10 x '100MG'$  TABS, Take 3 tablets by mouth 2 (two) times daily for 5 days. (Take nirmatrelvir 150 mg two tablets twice daily for 5 days and ritonavir 100 mg one tablet twice daily for 5 days) Patient GFR is >60, Disp: 30 tablet, Rfl: 0   albuterol (VENTOLIN HFA) 108 (90 Base) MCG/ACT inhaler, Inhale 1-2 puffs into the lungs every 6 (six) hours as needed for wheezing or shortness of breath., Disp: 1 each, Rfl: 0   amoxicillin-clavulanate (AUGMENTIN) 875-125 MG tablet, Take 1 tablet by mouth every 12 (twelve) hours., Disp: 14 tablet, Rfl: 0   benzonatate (TESSALON) 100 MG capsule, Take 1 capsule (100 mg total) by mouth every 8 (eight) hours., Disp: 21 capsule, Rfl: 0   ciprofloxacin (CIPRO) 750 MG tablet, Take 1 tablet (750 mg total) by mouth 2 (two) times daily., Disp: 20 tablet, Rfl: 0   cyclobenzaprine (FLEXERIL) 10 MG tablet, Take 1 tablet (10 mg total) by mouth 2 (two) times daily as needed for muscle spasms., Disp: 20 tablet, Rfl: 0   doxycycline (VIBRAMYCIN) 100 MG capsule, Take 1 capsule (100 mg total) by mouth 2 (two) times daily., Disp: 20 capsule, Rfl: 0   escitalopram (LEXAPRO) 5 MG tablet, Take 5 mg by mouth daily., Disp: , Rfl:    guanFACINE (INTUNIV) 1  MG TB24 ER tablet, Take 1 mg by mouth daily., Disp: , Rfl:    HYDROcodone-acetaminophen (NORCO/VICODIN) 5-325 MG tablet, Take 1 tablet by mouth every 6 (six) hours as needed for moderate pain or severe pain., Disp: 8 tablet, Rfl: 0   meclizine (ANTIVERT) 25 MG tablet, Take 1 tablet (25 mg total) by mouth 3 (three) times daily as needed for dizziness., Disp: 30 tablet, Rfl: 0   metoprolol tartrate (LOPRESSOR) 50 MG tablet, Take 1 tablet (50 mg total) by mouth 2 (two) times daily., Disp: 60 tablet, Rfl: 3   olmesartan (BENICAR) 40 MG tablet, Take 40 mg by mouth daily., Disp: , Rfl:    rosuvastatin (CRESTOR) 10 MG tablet, Take 10 mg by mouth at bedtime., Disp: , Rfl:    RYBELSUS 3 MG TABS, Take 1 tablet by mouth every morning., Disp: , Rfl:    sodium chloride (OCEAN) 0.65 % SOLN nasal spray, Place 1 spray into both nostrils as needed for congestion., Disp: 60 mL, Rfl: 0   Medications ordered in this encounter:  Meds ordered this encounter  Medications   nirmatrelvir/ritonavir EUA (PAXLOVID) 20 x 150 MG & 10 x '100MG'$  TABS    Sig: Take 3 tablets by mouth 2 (two) times daily for 5 days. (Take nirmatrelvir 150 mg two tablets twice daily for 5 days and ritonavir 100 mg one tablet twice daily for 5 days) Patient GFR is >60    Dispense:  30 tablet  Refill:  0    Order Specific Question:   Supervising Provider    Answer:   Chase Picket [[id number]]     *If you need refills on other medications prior to your next appointment, please contact your pharmacy*  Follow-Up: Call back or seek an in-person evaluation if the symptoms worsen or if the condition fails to improve as anticipated.  [place] [phone]  Other Instructions  Please keep well-hydrated and get plenty of rest. Start a saline nasal rinse to flush out your nasal passages. You can use plain Mucinex to help thin congestion. If you have a humidifier, running in the bedroom at night. I want you to start OTC vitamin  D3 1000 units daily, vitamin C 1000 mg daily, and a zinc supplement. Please take prescribed medications as directed.  You have been enrolled in a MyChart symptom monitoring program. Please answer these questions daily so we can keep track of how you are doing.  You were to quarantine for 5 days from onset of your symptoms.  After day 5, if you have had no fever and you are feeling better, you can end quarantine but need to mask for an additional 5 days. After day 5 if you have a fever or are having significant symptoms, please quarantine for full 10 days.  If you note any worsening of symptoms, any significant shortness of breath or any chest pain, please seek ER evaluation ASAP.  Please do not delay care!  COVID-19: What to Do if You Are Sick If you test positive and are an older adult or someone who is at high risk of getting very sick from COVID-19, treatment may be available. Contact a healthcare provider right away after a positive test to determine if you are eligible, even if your symptoms are mild right now. You can also visit a Test to Treat location and, if eligible, receive a prescription from a provider. Don't delay: Treatment must be started within the first few days to be effective. If you have a fever, cough, or other symptoms, you might have COVID-19. Most people have mild illness and are able to recover at home. If you are sick: Keep track of your symptoms. If you have an emergency warning sign (including trouble breathing), call 911. Steps to help prevent the spread of COVID-19 if you are sick If you are sick with COVID-19 or think you might have COVID-19, follow the steps below to care for yourself and to help protect other people in your home and community. Stay home except to get medical care Stay home. Most people with COVID-19 have mild illness and can recover at home without medical care. Do not leave your home, except to get medical care. Do not visit public areas and do  not go to places where you are unable to wear a mask. Take care of yourself. Get rest and stay hydrated. Take over-the-counter medicines, such as acetaminophen, to help you feel better. Stay in touch with your doctor. Call before you get medical care. Be sure to get care if you have trouble breathing, or have any other emergency warning signs, or if you think it is an emergency. Avoid public transportation, ride-sharing, or taxis if possible. Get tested If you have symptoms of COVID-19, get tested. While waiting for test results, stay away from others, including staying apart from those living in your household. Get tested as soon as possible after your symptoms start. Treatments may be available for people with  COVID-19 who are at risk for becoming very sick. Don't delay: Treatment must be started early to be effective--some treatments must begin within 5 days of your first symptoms. Contact your healthcare provider right away if your test result is positive to determine if you are eligible. Self-tests are one of several options for testing for the virus that causes COVID-19 and may be more convenient than laboratory-based tests and point-of-care tests. Ask your healthcare provider or your local health department if you need help interpreting your test results. You can visit your state, tribal, local, and territorial health department's website to look for the latest local information on testing sites. Separate yourself from other people As much as possible, stay in a specific room and away from other people and pets in your home. If possible, you should use a separate bathroom. If you need to be around other people or animals in or outside of the home, wear a well-fitting mask. Tell your close contacts that they may have been exposed to COVID-19. An infected person can spread COVID-19 starting 48 hours (or 2 days) before the person has any symptoms or tests positive. By letting your close contacts know  they may have been exposed to COVID-19, you are helping to protect everyone. See COVID-19 and Animals if you have questions about pets. If you are diagnosed with COVID-19, someone from the health department may call you. Answer the call to slow the spread. Monitor your symptoms Symptoms of COVID-19 include fever, cough, or other symptoms. Follow care instructions from your healthcare provider and local health department. Your local health authorities may give instructions on checking your symptoms and reporting information. When to seek emergency medical attention Look for emergency warning signs* for COVID-19. If someone is showing any of these signs, seek emergency medical care immediately: Trouble breathing Persistent pain or pressure in the chest New confusion Inability to wake or stay awake Pale, gray, or blue-colored skin, lips, or nail beds, depending on skin tone *This list is not all possible symptoms. Please call your medical provider for any other symptoms that are severe or concerning to you. Call 911 or call ahead to your local emergency facility: Notify the operator that you are seeking care for someone who has or may have COVID-19. Call ahead before visiting your doctor Call ahead. Many medical visits for routine care are being postponed or done by phone or telemedicine. If you have a medical appointment that cannot be postponed, call your doctor's office, and tell them you have or may have COVID-19. This will help the office protect themselves and other patients. If you are sick, wear a well-fitting mask You should wear a mask if you must be around other people or animals, including pets (even at home). Wear a mask with the best fit, protection, and comfort for you. You don't need to wear the mask if you are alone. If you can't put on a mask (because of trouble breathing, for example), cover your coughs and sneezes in some other way. Try to stay at least 6 feet away from other  people. This will help protect the people around you. Masks should not be placed on young children under age 7 years, anyone who has trouble breathing, or anyone who is not able to remove the mask without help. Cover your coughs and sneezes Cover your mouth and nose with a tissue when you cough or sneeze. Throw away used tissues in a lined trash can. Immediately wash your hands with soap and  water for at least 20 seconds. If soap and water are not available, clean your hands with an alcohol-based hand sanitizer that contains at least 60% alcohol. Clean your hands often Wash your hands often with soap and water for at least 20 seconds. This is especially important after blowing your nose, coughing, or sneezing; going to the bathroom; and before eating or preparing food. Use hand sanitizer if soap and water are not available. Use an alcohol-based hand sanitizer with at least 60% alcohol, covering all surfaces of your hands and rubbing them together until they feel dry. Soap and water are the best option, especially if hands are visibly dirty. Avoid touching your eyes, nose, and mouth with unwashed hands. Handwashing Tips Avoid sharing personal household items Do not share dishes, drinking glasses, cups, eating utensils, towels, or bedding with other people in your home. Wash these items thoroughly after using them with soap and water or put in the dishwasher. Clean surfaces in your home regularly Clean and disinfect high-touch surfaces (for example, doorknobs, tables, handles, light switches, and countertops) in your "sick room" and bathroom. In shared spaces, you should clean and disinfect surfaces and items after each use by the person who is ill. If you are sick and cannot clean, a caregiver or other person should only clean and disinfect the area around you (such as your bedroom and bathroom) on an as needed basis. Your caregiver/other person should wait as long as possible (at least several  hours) and wear a mask before entering, cleaning, and disinfecting shared spaces that you use. Clean and disinfect areas that may have blood, stool, or body fluids on them. Use household cleaners and disinfectants. Clean visible dirty surfaces with household cleaners containing soap or detergent. Then, use a household disinfectant. Use a product from [employer] List N: Disinfectants for Coronavirus (QASTM-19). Be sure to follow the instructions on the label to ensure safe and effective use of the product. Many products recommend keeping the surface wet with a disinfectant for a certain period of time (look at "contact time" on the product label). You may also need to wear personal protective equipment, such as gloves, depending on the directions on the product label. Immediately after disinfecting, wash your hands with soap and water for 20 seconds. For completed guidance on cleaning and disinfecting your home, visit Complete Disinfection Guidance. Take steps to improve ventilation at home Improve ventilation (air flow) at home to help prevent from spreading COVID-19 to other people in your household. Clear out COVID-19 virus particles in the air by opening windows, using air filters, and turning on fans in your home. Use this interactive tool to learn how to improve air flow in your home. When you can be around others after being sick with COVID-19 Deciding when you can be around others is different for different situations. Find out when you can safely end home isolation. For any additional questions about your care, contact your healthcare provider or state or local health department. 04/29/2020 Content source: [facility] ([facility]), Division of Viral Diseases This information is not intended to replace advice given to you by your health care provider. Make sure you discuss any questions you have with your health care provider. Document Revised: 06/12/2020  Document Reviewed: 06/12/2020 Elsevier Patient Education  2022 [employer].      If you have been instructed to have an in-person evaluation today at a local Urgent Care facility, please use the link below. It will take  you to a list of all of our available [facility], including address, phone number and hours of operation. Please do not delay care.  [facility]  If you or a family member do not have a primary care provider, use the link below to schedule a visit and establish care. When you choose a [facility] primary care physician or advanced practice provider, you gain a long-term partner in health. Find a Primary Care Provider  Learn more about [facility] in-office and virtual care options: [facility]

## 2022-01-05 NOTE — Progress Notes (Signed)
Virtual Visit Consent   Glenn Burton, you are scheduled for a virtual visit with a [facility] provider today. Just as with appointments in the office, your consent must be obtained to participate. Your consent will be active for this visit and any virtual visit you may have with one of our providers in the next 365 days. If you have a MyChart account, a copy of this consent can be sent to you electronically.  As this is a virtual visit, video technology does not allow for your provider to perform a traditional examination. This may limit your provider's ability to fully assess your condition. If your provider identifies any concerns that need to be evaluated in person or the need to arrange testing (such as labs, EKG, etc.), we will make arrangements to do so. Although advances in technology are sophisticated, we cannot ensure that it will always work on either your end or our end. If the connection with a video visit is poor, the visit may have to be switched to a telephone visit. With either a video or telephone visit, we are not always able to ensure that we have a secure connection.  By engaging in this virtual visit, you consent to the provision of healthcare and authorize for your insurance to be billed (if applicable) for the services provided during this visit. Depending on your insurance coverage, you may receive a charge related to this service.  I need to obtain your verbal consent now. Are you willing to proceed with your visit today? Glenn Burton has provided verbal consent on 01/05/2022 for a virtual visit (video or telephone). [name], NP  Date: 01/05/2022 10:51 AM  Virtual Visit via Video Note   I, [name], connected with  Glenn Burton  ([id number], 02-08-1986) on 01/05/22 at 11:00 AM EST by a video-enabled telemedicine application and verified that I am speaking with the correct person using two identifiers.  Location: Patient: Virtual Visit Location  Patient: Home Provider: Virtual Visit Location Provider: Home Office   I discussed the limitations of evaluation and management by telemedicine and the availability of in person appointments. The patient expressed understanding and agreed to proceed.    History of Present Illness: Glenn Burton is a 36 y.o. who identifies as a male who was assigned male at birth, and is being seen today for ongoing progression of sinus/covid symptoms. Was seen on 01/03/22 at [facility]- ED. At that time he was Dx with sinus infection and covid- given the duration of his symptoms he was placed on Augmentin for sinus infection.  Today he reports increased in congestion, on going fevers, aches. He noted that Saturday night he had developed a fever and that was new and was developing fatigue. He believes that was the onset of COVID given that these were the only new symptoms in last several weeks. Had tested for COVID in mid Nov and was negative. As his family members were + for it.  Fever is at home is reading Tmax- 101.1, now it is 99.5-100. Reports congestion has improved mildly with Augmentin use. Desires antiviral given increased COVID symptoms and asthma history.   Problems:  Patient Active Problem List   Diagnosis Date Noted   Cough 08/29/2020   Migraine headache 01/19/2011    Allergies:  Allergies  Allergen Reactions   Clindamycin/Lincomycin Rash   Medications:  Current Outpatient Medications:    albuterol (VENTOLIN HFA) 108 (90 Base) MCG/ACT inhaler, Inhale 1-2 puffs into the lungs every 6 (six) hours  as needed for wheezing or shortness of breath., Disp: 1 each, Rfl: 0   amoxicillin-clavulanate (AUGMENTIN) 875-125 MG tablet, Take 1 tablet by mouth every 12 (twelve) hours., Disp: 14 tablet, Rfl: 0   benzonatate (TESSALON) 100 MG capsule, Take 1 capsule (100 mg total) by mouth every 8 (eight) hours., Disp: 21 capsule, Rfl: 0   ciprofloxacin (CIPRO) 750 MG tablet, Take 1 tablet (750 mg total) by  mouth 2 (two) times daily., Disp: 20 tablet, Rfl: 0   cyclobenzaprine (FLEXERIL) 10 MG tablet, Take 1 tablet (10 mg total) by mouth 2 (two) times daily as needed for muscle spasms., Disp: 20 tablet, Rfl: 0   doxycycline (VIBRAMYCIN) 100 MG capsule, Take 1 capsule (100 mg total) by mouth 2 (two) times daily., Disp: 20 capsule, Rfl: 0   escitalopram (LEXAPRO) 5 MG tablet, Take 5 mg by mouth daily., Disp: , Rfl:    guanFACINE (INTUNIV) 1 MG TB24 ER tablet, Take 1 mg by mouth daily., Disp: , Rfl:    HYDROcodone-acetaminophen (NORCO/VICODIN) 5-325 MG tablet, Take 1 tablet by mouth every 6 (six) hours as needed for moderate pain or severe pain., Disp: 8 tablet, Rfl: 0   meclizine (ANTIVERT) 25 MG tablet, Take 1 tablet (25 mg total) by mouth 3 (three) times daily as needed for dizziness., Disp: 30 tablet, Rfl: 0   metoprolol tartrate (LOPRESSOR) 50 MG tablet, Take 1 tablet (50 mg total) by mouth 2 (two) times daily., Disp: 60 tablet, Rfl: 3   olmesartan (BENICAR) 40 MG tablet, Take 40 mg by mouth daily., Disp: , Rfl:    rosuvastatin (CRESTOR) 10 MG tablet, Take 10 mg by mouth at bedtime., Disp: , Rfl:    RYBELSUS 3 MG TABS, Take 1 tablet by mouth every morning., Disp: , Rfl:    sodium chloride (OCEAN) 0.65 % SOLN nasal spray, Place 1 spray into both nostrils as needed for congestion., Disp: 60 mL, Rfl: 0  Observations/Objective: Patient is well-developed, well-nourished in no acute distress.  Resting comfortably  at home.  Head is normocephalic, atraumatic.  No labored breathing.  Speech is clear and coherent with logical content.  Patient is alert and oriented at baseline.    Assessment and Plan: 1. COVID-19  - nirmatrelvir/ritonavir EUA (PAXLOVID) 20 x 150 MG & 10 x '100MG'$  TABS; Take 3 tablets by mouth 2 (two) times daily for 5 days. (Take nirmatrelvir 150 mg two tablets twice daily for 5 days and ritonavir 100 mg one tablet twice daily for 5 days) Patient GFR is >60  Dispense: 30 tablet; Refill:  0  -Take meds as prescribed -COVID info on AVS -Rest -Use a cool mist humidifier especially during the winter months when heat dries out the air. - Use saline nose sprays frequently to help soothe nasal passages and promote drainage. -Saline irrigations of the nose can be very helpful if done frequently.             * 4X daily for 1 week*             * Use of a nettie pot can be helpful with this.  *Follow directions with this* *Boiled or distilled water only -stay hydrated by drinking plenty of fluids - Keep thermostat turn down low to prevent drying out sinuses - For any cough or congestion- robitussin DM or Delsym as needed - For fever or aches or pains- take tylenol or ibuprofen as directed on bottle             *  for fevers greater than 101 orally you may alternate ibuprofen and tylenol every 3 hours.  If you do not improve you will need a follow up visit in person.                Reviewed side effects, risks and benefits of medication.    Patient acknowledged agreement and understanding of the plan.   Past Medical, Surgical, Social History, Allergies, and Medications have been Reviewed.     Follow Up Instructions: I discussed the assessment and treatment plan with the patient. The patient was provided an opportunity to ask questions and all were answered. The patient agreed with the plan and demonstrated an understanding of the instructions.  A copy of instructions were sent to the patient via MyChart unless otherwise noted below.     The patient was advised to call back or seek an in-person evaluation if the symptoms worsen or if the condition fails to improve as anticipated.  Time:  I spent 10 minutes with the patient via telehealth technology discussing the above problems/concerns.    [name], NP

## 2022-01-07 DIAGNOSIS — U071 COVID-19: Secondary | ICD-10-CM | POA: Diagnosis not present

## 2022-01-07 DIAGNOSIS — Z6834 Body mass index (BMI) 34.0-34.9, adult: Secondary | ICD-10-CM | POA: Diagnosis not present

## 2022-01-07 DIAGNOSIS — R430 Anosmia: Secondary | ICD-10-CM | POA: Diagnosis not present

## 2022-01-07 DIAGNOSIS — J329 Chronic sinusitis, unspecified: Secondary | ICD-10-CM | POA: Diagnosis not present

## 2022-01-11 ENCOUNTER — Other Ambulatory Visit (HOSPITAL_COMMUNITY): Payer: Self-pay

## 2022-01-11 DIAGNOSIS — F902 Attention-deficit hyperactivity disorder, combined type: Secondary | ICD-10-CM | POA: Diagnosis not present

## 2022-01-11 DIAGNOSIS — F4312 Post-traumatic stress disorder, chronic: Secondary | ICD-10-CM | POA: Diagnosis not present

## 2022-01-11 DIAGNOSIS — Z79899 Other long term (current) drug therapy: Secondary | ICD-10-CM | POA: Diagnosis not present

## 2022-01-11 DIAGNOSIS — F419 Anxiety disorder, unspecified: Secondary | ICD-10-CM | POA: Diagnosis not present

## 2022-01-11 MED ORDER — LISDEXAMFETAMINE DIMESYLATE 40 MG PO CAPS
40.0000 mg | ORAL_CAPSULE | Freq: Every day | ORAL | 0 refills | Status: DC
  Filled 2022-01-11: qty 30, 30d supply, fill #0

## 2022-01-20 ENCOUNTER — Other Ambulatory Visit (HOSPITAL_COMMUNITY): Payer: Self-pay

## 2022-02-09 ENCOUNTER — Other Ambulatory Visit (HOSPITAL_COMMUNITY): Payer: Self-pay

## 2022-02-09 MED ORDER — LISDEXAMFETAMINE DIMESYLATE 40 MG PO CAPS
40.0000 mg | ORAL_CAPSULE | Freq: Every day | ORAL | 0 refills | Status: DC
  Filled 2022-02-09: qty 30, 30d supply, fill #0

## 2022-02-12 DIAGNOSIS — F4312 Post-traumatic stress disorder, chronic: Secondary | ICD-10-CM | POA: Diagnosis not present

## 2022-02-23 DIAGNOSIS — F4312 Post-traumatic stress disorder, chronic: Secondary | ICD-10-CM | POA: Diagnosis not present

## 2022-03-09 DIAGNOSIS — F4312 Post-traumatic stress disorder, chronic: Secondary | ICD-10-CM | POA: Diagnosis not present

## 2022-03-19 ENCOUNTER — Other Ambulatory Visit (HOSPITAL_COMMUNITY): Payer: Self-pay

## 2022-03-19 MED ORDER — LISDEXAMFETAMINE DIMESYLATE 50 MG PO CAPS
50.0000 mg | ORAL_CAPSULE | Freq: Every day | ORAL | 0 refills | Status: DC
  Filled 2022-03-19: qty 30, 30d supply, fill #0

## 2022-04-06 DIAGNOSIS — R0982 Postnasal drip: Secondary | ICD-10-CM | POA: Diagnosis not present

## 2022-04-06 DIAGNOSIS — H9042 Sensorineural hearing loss, unilateral, left ear, with unrestricted hearing on the contralateral side: Secondary | ICD-10-CM | POA: Diagnosis not present

## 2022-04-09 DIAGNOSIS — F419 Anxiety disorder, unspecified: Secondary | ICD-10-CM | POA: Diagnosis not present

## 2022-04-09 DIAGNOSIS — Z79891 Long term (current) use of opiate analgesic: Secondary | ICD-10-CM | POA: Diagnosis not present

## 2022-04-09 DIAGNOSIS — Z5181 Encounter for therapeutic drug level monitoring: Secondary | ICD-10-CM | POA: Diagnosis not present

## 2022-04-09 DIAGNOSIS — F902 Attention-deficit hyperactivity disorder, combined type: Secondary | ICD-10-CM | POA: Diagnosis not present

## 2022-04-09 DIAGNOSIS — Z79899 Other long term (current) drug therapy: Secondary | ICD-10-CM | POA: Diagnosis not present

## 2022-04-20 DIAGNOSIS — F4312 Post-traumatic stress disorder, chronic: Secondary | ICD-10-CM | POA: Diagnosis not present

## 2022-04-27 ENCOUNTER — Other Ambulatory Visit (HOSPITAL_COMMUNITY): Payer: Self-pay

## 2022-05-04 ENCOUNTER — Other Ambulatory Visit: Payer: Self-pay

## 2022-05-04 ENCOUNTER — Other Ambulatory Visit (HOSPITAL_COMMUNITY): Payer: Self-pay

## 2022-05-04 DIAGNOSIS — F4312 Post-traumatic stress disorder, chronic: Secondary | ICD-10-CM | POA: Diagnosis not present

## 2022-05-05 ENCOUNTER — Other Ambulatory Visit: Payer: Self-pay

## 2022-05-05 ENCOUNTER — Other Ambulatory Visit (HOSPITAL_COMMUNITY): Payer: Self-pay

## 2022-05-05 MED ORDER — LISDEXAMFETAMINE DIMESYLATE 50 MG PO CAPS
50.0000 mg | ORAL_CAPSULE | Freq: Every day | ORAL | 0 refills | Status: DC
  Filled 2022-05-05: qty 30, 30d supply, fill #0

## 2022-05-31 DIAGNOSIS — F4312 Post-traumatic stress disorder, chronic: Secondary | ICD-10-CM | POA: Diagnosis not present

## 2022-06-14 ENCOUNTER — Other Ambulatory Visit (HOSPITAL_COMMUNITY): Payer: Self-pay

## 2022-06-14 MED ORDER — LISDEXAMFETAMINE DIMESYLATE 50 MG PO CAPS
50.0000 mg | ORAL_CAPSULE | Freq: Every day | ORAL | 0 refills | Status: AC
  Filled 2022-06-14: qty 30, 30d supply, fill #0

## 2022-06-15 ENCOUNTER — Other Ambulatory Visit (HOSPITAL_COMMUNITY): Payer: Self-pay

## 2022-06-15 DIAGNOSIS — R000 Tachycardia, unspecified: Secondary | ICD-10-CM | POA: Diagnosis not present

## 2022-06-15 DIAGNOSIS — J45909 Unspecified asthma, uncomplicated: Secondary | ICD-10-CM

## 2022-06-15 DIAGNOSIS — I10 Essential (primary) hypertension: Secondary | ICD-10-CM | POA: Diagnosis not present

## 2022-06-15 DIAGNOSIS — R7303 Prediabetes: Secondary | ICD-10-CM | POA: Diagnosis not present

## 2022-06-15 DIAGNOSIS — E782 Mixed hyperlipidemia: Secondary | ICD-10-CM | POA: Diagnosis not present

## 2022-06-15 DIAGNOSIS — G8929 Other chronic pain: Secondary | ICD-10-CM | POA: Insufficient documentation

## 2022-06-15 HISTORY — DX: Unspecified asthma, uncomplicated: J45.909

## 2022-06-15 HISTORY — DX: Other chronic pain: G89.29

## 2022-06-28 DIAGNOSIS — F4312 Post-traumatic stress disorder, chronic: Secondary | ICD-10-CM | POA: Diagnosis not present

## 2022-06-29 ENCOUNTER — Ambulatory Visit
Admission: RE | Admit: 2022-06-29 | Discharge: 2022-06-29 | Disposition: A | Discharge status: Home / Self Care | Payer: BC Managed Care – PPO | Source: Ambulatory Visit | Attending: Family Medicine | Admitting: Family Medicine

## 2022-06-29 VITALS — BP 136/87 | HR 66 | Temp 98.1°F | Resp 18 | Ht 73.0 in | Wt 247.0 lb

## 2022-06-29 DIAGNOSIS — J0101 Acute recurrent maxillary sinusitis: Secondary | ICD-10-CM

## 2022-06-29 MED ORDER — AMOXICILLIN-POT CLAVULANATE 875-125 MG PO TABS: 1.0000 | ORAL_TABLET | Freq: Two times a day (BID) | ORAL | 0 refills | Status: AC

## 2022-06-29 NOTE — ED Provider Notes (Signed)
[facility]    CSN: [id number] Arrival date & time: 06/29/22  1322      History   Chief Complaint Chief Complaint  Patient presents with   Cough    Cough and congestion with sore throat and on and off fever with sweats. - Entered by patient    HPI Glenn Burton is a 37 y.o. male.   Patient is here for a cough and mild sore throat for about a week. He has a h/o "sinus infections" and thought this was that with a lot of drainage causing his symptoms. At night he is worse.  He has an on/off fever at night, no more that 100, night sweats.  He is fatigued in the morning.  He does have sinus pressure under his eyes.  His left ear is "bothering him".   No wheezing or sob.  He has been using motrin more for the sore throat, daily zyrtec.  He has been out of flonase.  He does see ENT.        Past Medical History:  Diagnosis Date   ADHD    Allergy    Anxiety    Asthma    Hypercholesteremia    Hyperlipidemia    Hypertension    Lipoma    Migraine    Pericarditis    Pericarditis    Prediabetes    Sinus tachycardia     Patient Active Problem List   Diagnosis Date Noted   Cough 08/29/2020   Migraine headache 01/19/2011    Past Surgical History:  Procedure Laterality Date   TONSILLECTOMY  age 87   tubes in ears   tubes         Home Medications    Prior to Admission medications   Medication Sig Start Date End Date Taking? Authorizing Provider  allopurinol (ZYLOPRIM) 100 MG tablet Take 1 tablet by mouth daily. 06/15/22 09/13/22 Yes [provider]  cholecalciferol (VITAMIN D3) 25 MCG (1000 UNIT) tablet Take by mouth. 06/16/22 06/23/23 Yes [provider]  cyclobenzaprine (FLEXERIL) 10 MG tablet Take 1 tablet (10 mg total) by mouth 2 (two) times daily as needed for muscle spasms. 02/07/16  Yes Garlon Hatchet, PA-C  cyclobenzaprine (FLEXERIL) 10 MG tablet Take 5 mg by mouth 3 (three) times daily as needed. 06/15/22  Yes [provider]  D3 HIGH POTENCY 25 MCG (1000 UT) capsule Take 1,000 Units by mouth daily. 06/16/22  Yes [provider]  escitalopram (LEXAPRO) 5 MG tablet Take 5 mg by mouth daily. 05/03/21  Yes [provider]  lisdexamfetamine (VYVANSE) 50 MG capsule Take 1 capsule (50 mg total) by mouth daily. 05/05/22  Yes   metoprolol tartrate (LOPRESSOR) 50 MG tablet Take by mouth. 06/15/22 09/13/22 Yes [provider]  olmesartan (BENICAR) 40 MG tablet Take 40 mg by mouth daily.   Yes [provider]  rosuvastatin (CRESTOR) 10 MG tablet Take 10 mg by mouth at bedtime. 09/18/20  Yes [provider]  rosuvastatin (CRESTOR) 5 MG tablet Take 1 tablet by mouth daily. 06/16/22 09/14/22 Yes [provider]  albuterol (VENTOLIN HFA) 108 (90 Base) MCG/ACT inhaler Inhale 1-2 puffs into the lungs every 6 (six) hours as needed for wheezing or shortness of breath. 08/29/20   Wieters, Hallie C, PA-C  amoxicillin-clavulanate (AUGMENTIN) 875-125 MG tablet Take 1 tablet by mouth every 12 (twelve) hours. 01/03/22   Ernie Avena, MD  benzonatate (TESSALON) 100 MG capsule Take 1 capsule (100 mg total) by mouth  every 8 (eight) hours. 01/03/22   Ernie Avena, MD  guanFACINE (INTUNIV) 1 MG TB24 ER tablet Take 1 mg by mouth daily. 10/20/20   [provider]  HYDROcodone-acetaminophen (NORCO/VICODIN) 5-325 MG tablet Take 1 tablet by mouth every 6 (six) hours as needed for moderate pain or severe pain. 10/13/21   Mardella Layman, MD  lisdexamfetamine (VYVANSE) 10 MG capsule     [provider]  lisdexamfetamine (VYVANSE) 40 MG capsule Take 1 capsule (40 mg total) by mouth daily. 01/11/22     lisdexamfetamine (VYVANSE) 40 MG capsule Take 1 capsule (40 mg total) by mouth daily. 02/09/22     lisdexamfetamine (VYVANSE) 50 MG capsule Take 1 capsule (50 mg total) by mouth daily. 06/14/22     Methocarbamol (ROBAXIN IJ)     [provider]  metoprolol tartrate (LOPRESSOR) 50 MG tablet Take 1  tablet (50 mg total) by mouth 2 (two) times daily. 10/29/20 09/14/21  Cantwell, Celeste C, PA-C  Sertraline HCl (ZOLOFT PO)     [provider]  sodium chloride (OCEAN) 0.65 % SOLN nasal spray Place 1 spray into both nostrils as needed for congestion. 01/03/22   Ernie Avena, MD    Family History Family History  Problem Relation Age of Onset   Migraines Mother    Hyperlipidemia Mother    Hypertension Mother    Diabetes Father    Cancer Father    Hypertension Father    Coronary artery disease Father    Heart disease Father    Hyperlipidemia Father    Heart disease Paternal Grandmother    Cancer Paternal Grandfather     Social History Social History   Tobacco Use   Smoking status: Former    Packs/day: 0.25    Years: 1.00    Additional pack years: 0.00    Total pack years: 0.25    Types: Cigarettes    Quit date: 04/26/2007    Years since quitting: 15.1   Smokeless tobacco: Never  Vaping Use   Vaping Use: Never used  Substance Use Topics   Alcohol use: Yes    Comment: occasional   Drug use: Never     Allergies   Other, Clindamycin, and Clindamycin/lincomycin   Review of Systems Review of Systems  Constitutional:  Positive for fatigue and fever.  HENT:  Positive for congestion, rhinorrhea and sinus pressure.   Respiratory:  Positive for cough.   Cardiovascular: Negative.   Gastrointestinal: Negative.   Genitourinary: Negative.   Musculoskeletal: Negative.   Psychiatric/Behavioral: Negative.       Physical Exam Triage Vital Signs ED Triage Vitals  Enc Vitals Group     BP 06/29/22 1350 136/87     Pulse Rate 06/29/22 1350 66     Resp 06/29/22 1350 18     Temp 06/29/22 1350 98.1 F (36.7 C)     Temp Source 06/29/22 1350 Oral     SpO2 06/29/22 1350 98 %     Weight 06/29/22 1344 247 lb (112 kg)     Height 06/29/22 1344 6\' 1"  (1.854 m)     Head Circumference --      Peak Flow --      Pain Score 06/29/22 1344 0     Pain Loc --      Pain Edu? --       Excl. in GC? --    No data found.  Updated Vital Signs BP 136/87 (BP Location: Left Arm)   Pulse 66   Temp  98.1 F (36.7 C) (Oral)   Resp 18   Ht 6\' 1"  (1.854 m)   Wt 112 kg   SpO2 98%   BMI 32.59 kg/m   Visual Acuity Right Eye Distance:   Left Eye Distance:   Bilateral Distance:    Right Eye Near:   Left Eye Near:    Bilateral Near:     Physical Exam Constitutional:      Appearance: Normal appearance.  HENT:     Right Ear: A middle ear effusion is present.     Left Ear: A middle ear effusion is present.     Nose:     Right Sinus: Maxillary sinus tenderness present.     Left Sinus: Maxillary sinus tenderness present.     Mouth/Throat:     Mouth: Mucous membranes are moist.     Pharynx: Posterior oropharyngeal erythema present. No pharyngeal swelling, oropharyngeal exudate or uvula swelling.     Tonsils: No tonsillar exudate.  Cardiovascular:     Rate and Rhythm: Normal rate and regular rhythm.  Pulmonary:     Effort: Pulmonary effort is normal.     Breath sounds: Normal breath sounds.  Musculoskeletal:     Cervical back: Normal range of motion and neck supple. Tenderness present.  Lymphadenopathy:     Cervical: No cervical adenopathy.  Skin:    General: Skin is warm.  Neurological:     General: No focal deficit present.     Mental Status: He is alert.  Psychiatric:        Mood and Affect: Mood normal.      UC Treatments / Results  Labs (all labs ordered are listed, but only abnormal results are displayed) Labs Reviewed - No data to display  EKG   Radiology No results found.  Procedures Procedures (including critical care time)  Medications Ordered in UC Medications - No data to display  Initial Impression / Assessment and Plan / UC Course  I have reviewed the triage vital signs and the nursing notes.  Pertinent labs & imaging results that were available during my care of the patient were reviewed by me and considered in my medical  decision making (see chart for details).  Final Clinical Impressions(s) / UC Diagnoses   Final diagnoses:  Acute recurrent maxillary sinusitis     Discharge Instructions      You were diagnosed with acute sinusitis today.  I have sent out augmentin for this today.  Please continue your zyrtec, and I do recommend flonase nasal spray.  Please return if not improving or worsening.      ED Prescriptions     Medication Sig Dispense Auth. Provider   amoxicillin-clavulanate (AUGMENTIN) 875-125 MG tablet Take 1 tablet by mouth every 12 (twelve) hours for 10 days. 20 tablet Jannifer Franklin, MD      PDMP not reviewed this encounter.   Jannifer Franklin, MD 06/29/22 1438

## 2022-06-29 NOTE — Discharge Instructions (Addendum)
You were diagnosed with acute sinusitis today.  I have sent out augmentin for this today.  Please continue your zyrtec, and I do recommend flonase nasal spray.  Please return if not improving or worsening.

## 2022-06-29 NOTE — ED Triage Notes (Signed)
Here for "Cough" on/off that is accompanied by scratchy throat and drainage in my throat especially when lying flat, history of sinus infections. Fever "on/off at night, highest 100.0". Symptoms have been about "a week now". Voids "fine". Stools "fine". Ear pain at times too.

## 2022-07-12 DIAGNOSIS — F419 Anxiety disorder, unspecified: Secondary | ICD-10-CM | POA: Diagnosis not present

## 2022-07-12 DIAGNOSIS — F902 Attention-deficit hyperactivity disorder, combined type: Secondary | ICD-10-CM | POA: Diagnosis not present

## 2022-07-12 DIAGNOSIS — Z79899 Other long term (current) drug therapy: Secondary | ICD-10-CM | POA: Diagnosis not present

## 2022-09-09 ENCOUNTER — Emergency Department (HOSPITAL_COMMUNITY)
Admission: EM | Admit: 2022-09-09 | Discharge: 2022-09-09 | Disposition: A | Discharge status: Home / Self Care | Payer: BC Managed Care – PPO | Attending: Emergency Medicine | Admitting: Emergency Medicine

## 2022-09-09 ENCOUNTER — Encounter (HOSPITAL_COMMUNITY): Payer: Self-pay

## 2022-09-09 ENCOUNTER — Emergency Department (HOSPITAL_COMMUNITY): Payer: BC Managed Care – PPO

## 2022-09-09 ENCOUNTER — Other Ambulatory Visit: Payer: Self-pay

## 2022-09-09 DIAGNOSIS — J45909 Unspecified asthma, uncomplicated: Secondary | ICD-10-CM | POA: Diagnosis not present

## 2022-09-09 DIAGNOSIS — I10 Essential (primary) hypertension: Secondary | ICD-10-CM | POA: Insufficient documentation

## 2022-09-09 DIAGNOSIS — Z79899 Other long term (current) drug therapy: Secondary | ICD-10-CM | POA: Diagnosis not present

## 2022-09-09 DIAGNOSIS — R519 Headache, unspecified: Secondary | ICD-10-CM | POA: Insufficient documentation

## 2022-09-09 DIAGNOSIS — R079 Chest pain, unspecified: Secondary | ICD-10-CM | POA: Insufficient documentation

## 2022-09-09 DIAGNOSIS — R61 Generalized hyperhidrosis: Secondary | ICD-10-CM | POA: Insufficient documentation

## 2022-09-09 DIAGNOSIS — R0789 Other chest pain: Secondary | ICD-10-CM | POA: Diagnosis not present

## 2022-09-09 LAB — COMPREHENSIVE METABOLIC PANEL
ALT: 42 U/L (ref 0–44)
AST: 29 U/L (ref 15–41)
Albumin: 4.1 g/dL (ref 3.5–5.0)
Alkaline Phosphatase: 52 U/L (ref 38–126)
Anion gap: 7 (ref 5–15)
BUN: 12 mg/dL (ref 6–20)
CO2: 26 mmol/L (ref 22–32)
Calcium: 9.1 mg/dL (ref 8.9–10.3)
Chloride: 104 mmol/L (ref 98–111)
Creatinine, Ser: 0.93 mg/dL (ref 0.61–1.24)
GFR, Estimated: >60 mL/min (ref >60)
Glucose, Bld: 94 mg/dL (ref 70–99)
Potassium: 3.9 mmol/L (ref 3.5–5.1)
Sodium: 137 mmol/L (ref 135–145)
Total Bilirubin: 0.8 mg/dL (ref 0.3–1.2)
Total Protein: 6.8 g/dL (ref 6.5–8.1)

## 2022-09-09 LAB — CBC WITH DIFFERENTIAL/PLATELET
Abs Immature Granulocytes: 0.04 10*3/uL (ref 0.00–0.07)
Basophils Absolute: 0.1 10*3/uL (ref 0.0–0.1)
Basophils Relative: 1 %
Eosinophils Absolute: 0.2 10*3/uL (ref 0.0–0.5)
Eosinophils Relative: 2 %
HCT: 42.5 % (ref 39.0–52.0)
Hemoglobin: 14.6 g/dL (ref 13.0–17.0)
Immature Granulocytes: 0 %
Lymphocytes Relative: 36 %
Lymphs Abs: 3.3 10*3/uL (ref 0.7–4.0)
MCH: 30.8 pg (ref 26.0–34.0)
MCHC: 34.4 g/dL (ref 30.0–36.0)
MCV: 89.7 fL (ref 80.0–100.0)
Monocytes Absolute: 0.8 10*3/uL (ref 0.1–1.0)
Monocytes Relative: 8 %
Neutro Abs: 4.7 10*3/uL (ref 1.7–7.7)
Neutrophils Relative %: 53 %
Platelets: 214 10*3/uL (ref 150–400)
RBC: 4.74 MIL/uL (ref 4.22–5.81)
RDW: 12.2 % (ref 11.5–15.5)
WBC: 9 10*3/uL (ref 4.0–10.5)
nRBC: 0 % (ref 0.0–0.2)

## 2022-09-09 LAB — TROPONIN I (HIGH SENSITIVITY)
Troponin I (High Sensitivity): <2 ng/L (ref <18)
Troponin I (High Sensitivity): <2 ng/L (ref <18)

## 2022-09-09 NOTE — ED Provider Notes (Signed)
Clinical Course as of 09/10/22 0047  Thu Sep 09, 2022  1524 Stable. 36yM w/ htn, hld. Here w/ chest pain. F/u 2nd troponin. EKG with slight ST elevations in inferior leads but not enough for stemi criteria.  - f/u trop - f/u repeat EKG - f/u pain control [CD]    Clinical Course User Index [CD] Rhys Martini, DO    Physical Exam  BP 122/89   Pulse 73   Temp 98.4 F (36.9 C) (Oral)   Resp (!) 21   Ht 6\' 1"  (1.854 m)   Wt 115.2 kg   SpO2 95%   BMI 33.51 kg/m   Physical Exam Vitals and nursing note reviewed.  Constitutional:      General: He is not in acute distress.    Appearance: He is well-developed.  HENT:     Head: Normocephalic and atraumatic.  Eyes:     Conjunctiva/sclera: Conjunctivae normal.  Cardiovascular:     Rate and Rhythm: Normal rate and regular rhythm.     Heart sounds: No murmur heard. Pulmonary:     Effort: Pulmonary effort is normal. No respiratory distress.     Breath sounds: Normal breath sounds.     Comments: Saturating well on room air Musculoskeletal:        General: No swelling.     Cervical back: Neck supple.  Skin:    General: Skin is warm and dry.     Capillary Refill: Capillary refill takes less than 2 seconds.  Neurological:     Mental Status: He is alert and oriented to person, place, and time.  Psychiatric:        Mood and Affect: Mood normal.     Procedures  Procedures  ED Course / MDM   Clinical Course as of 09/10/22 0047  Thu Sep 09, 2022  1524 Stable. 36yM w/ htn, hld. Here w/ chest pain. F/u 2nd troponin. EKG with slight ST elevations in inferior leads but not enough for stemi criteria.  - f/u trop - f/u repeat EKG - f/u pain control [CD]    Clinical Course User Index [CD] Rhys Martini, DO   Medical Decision Making Problems Addressed: Chest pain, unspecified type: complicated acute illness or injury  Amount and/or Complexity of Data Reviewed Independent Historian: spouse External Data Reviewed:  ECG and notes. Labs: ordered. Radiology: ordered and independent interpretation performed. ECG/medicine tests: ordered and independent interpretation performed.   As noted above, patient presented for chest pain.  Physical exam on my evaluation is relatively unremarkable.  Lab work reviewed no acute abnormalities including 2 negative troponins.  Chest x-ray showed no acute abnormalities.  Initial EKG showed a slight elevation in the inferior leads, but not enough STEMI criteria.  His pain increased while in the ED.  Repeat EKG performed.  It looks very similar to prior.  No evidence of STEMI.  Possibly some slight scattered PR depression.  He notes that he has had pericarditis before, but he was much more ill at that time.  He is overall very well-appearing.  Believe he is appropriate for discharge home at this time.  Will refer to cardiology for close follow-up care, especially regarding significant family history of cardiac disease.  He is provided with strict return precautions.  He states understanding and agreement with plan for discharge at this time.  Spouse present for discussion of plan.  They also state understanding and agreement with discharge       Rhys Martini, DO 09/10/22 1610  Gerhard Munch, MD 09/12/22 [phone]

## 2022-09-09 NOTE — ED Provider Notes (Signed)
[name] Provider Note  MDM   HPI/ROS:  Glenn Burton is a 37 y.o. male with hypertension, hyperlipidemia olmesartan, metoprolol, rosuvastatin presenting with chief complaint of chest pain/pressure.  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  He is also experiencing some degree of shortness of breath associated with chest pressure.  After onset, patient sat down and in the truck with [facility] on and drank several bottles of water.  He also took 324 of aspirin.  After the pain did not fully subside, patient took a twelve-lead EKG on himself, noting concerning ST changes in inferior leads.  This prompted him to report to the emergency department for further evaluation.  Patient has no history of cardiac events, but does have a history of intermittent tachycardia that has not been labeled as SVT.  No cigarettes.  Patient does have a family history of MIs, with father experiencing several MIs between the ages of 50 and 62.    Physical exam is notable for: -Well-appearing, no acute distress - Cardiopulmonary exam benign - No lower extremity edema  On my initial evaluation, patient is:  -Vital signs stable. Patient afebrile, hemodynamically stable, and non-toxic appearing. -Additional history obtained from chart review  Given the patient's history and physical exam, differential diagnosis includes but is not limited to ACS, pulmonary embolism, musculoskeletal pain, aortic pathology, pneumonia, pneumothorax, etc.  Interpretations, interventions, and the patient's course of care are documented below.    EKG obtained, interpreted by myself and attending physician.  Notable for what appears to be Q waves in inferior leads as well as slight ST elevation in inferior leads, not meeting STEMI criteria.   Similar changes present on prior EKGs.  Given concerning history with EKG changes, cardiopulmonary workup obtained.  Initial troponin undetectable.  Heart score 3.  Upon reassessment, patient claims pain is starting to return.  Repeat EKG ordered.  Second troponin ordered.  Disposition:  Case patient was seen by oncoming team providers at time of signout at approximately 1500.  Plan at time of signout is to follow-up second troponin, obtain second EKG, follow-up pain control.  Clinical Impression: No diagnosis found.  Rx / DC Orders ED Discharge Orders     None       The plan for this patient was discussed with Dr. Rosalia Hammers, who voiced agreement and who oversaw evaluation and treatment of this patient.   Clinical Complexity A medically appropriate history, review of systems, and physical exam was performed.  My independent interpretations of EKG, labs, and radiology are documented in the ED course above.   Click here for ABCD2, HEART and other calculatorsREFRESH Note before signing   Patient's presentation is most consistent with acute presentation with potential threat to life or bodily function.  Medical Decision Making Amount and/or Complexity of Data Reviewed Labs: ordered. Radiology: ordered.    HPI/ROS      See MDM section for pertinent HPI and ROS. A complete ROS was performed with pertinent positives/negatives noted above.   Past Medical History:  Diagnosis Date   ADHD    Allergy    Anxiety    Asthma    Hypercholesteremia    Hyperlipidemia    Hypertension    Lipoma    Migraine    Pericarditis    Pericarditis    Prediabetes    Sinus tachycardia  Past Surgical History:  Procedure Laterality Date   TONSILLECTOMY  age 57   tubes in ears   tubes        Physical Exam   Vitals:   09/09/22 1206 09/09/22 1210 09/09/22 1500  BP:  129/79 126/84  Pulse:  75 65  Resp:  20 15  Temp:  98.2 F (36.8 C)   TempSrc:  Oral   SpO2:  97% 100%  Weight:  115.2 kg    Height: 6\' 1"  (1.854 m)      Physical Exam Vitals and nursing note reviewed.  Constitutional:      General: He is not in acute distress.    Appearance: He is well-developed.  HENT:     Head: Normocephalic and atraumatic.  Eyes:     Conjunctiva/sclera: Conjunctivae normal.  Cardiovascular:     Rate and Rhythm: Normal rate and regular rhythm.     Heart sounds: No murmur heard. Pulmonary:     Effort: Pulmonary effort is normal. No respiratory distress.     Breath sounds: Normal breath sounds.  Abdominal:     Palpations: Abdomen is soft.     Tenderness: There is no abdominal tenderness.  Musculoskeletal:        General: No swelling.     Cervical back: Neck supple.     Right lower leg: No edema.     Left lower leg: No edema.  Skin:    General: Skin is warm and dry.     Capillary Refill: Capillary refill takes less than 2 seconds.  Neurological:     Mental Status: He is alert.  Psychiatric:        Mood and Affect: Mood normal.    Starleen Arms, MD Department of Emergency Medicine   Please note that this documentation was produced with the assistance of voice-to-text technology and may contain errors.    Dyanne Iha, MD 09/09/22 1529    Margarita Grizzle, MD 09/10/22 [phone]

## 2022-09-09 NOTE — ED Triage Notes (Signed)
Patient arrived by EMS today from firehouse. 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 No loc noted from patient.Central chest pressure came down after getting aspirin. Was given of normal saline in route. Patient states that the chest pressure is still present in the center of his chest.

## 2022-09-09 NOTE — Discharge Instructions (Signed)
Alternate Motrin and Tylenol as needed for pain.  Follow-up with primary care physician in the next 2 to 3 days.  Also follow-up with cardiology as needed. A referral has been placed.  If you do not hear from them in the next 2-3 business days, please feel free to reach out.  Monitor for any signs of worse including increased pain, shortness of breath, changes in mental status.  Return to the ED for any worsening symptoms or further concerns.

## 2022-09-21 DIAGNOSIS — F4312 Post-traumatic stress disorder, chronic: Secondary | ICD-10-CM | POA: Diagnosis not present

## 2022-10-05 DIAGNOSIS — Z79899 Other long term (current) drug therapy: Secondary | ICD-10-CM | POA: Diagnosis not present

## 2022-10-05 DIAGNOSIS — F4312 Post-traumatic stress disorder, chronic: Secondary | ICD-10-CM | POA: Diagnosis not present

## 2022-10-05 DIAGNOSIS — F902 Attention-deficit hyperactivity disorder, combined type: Secondary | ICD-10-CM | POA: Diagnosis not present

## 2022-10-19 DIAGNOSIS — F4312 Post-traumatic stress disorder, chronic: Secondary | ICD-10-CM | POA: Diagnosis not present

## 2022-10-27 DIAGNOSIS — Z23 Encounter for immunization: Secondary | ICD-10-CM | POA: Diagnosis not present

## 2022-12-02 DIAGNOSIS — F4312 Post-traumatic stress disorder, chronic: Secondary | ICD-10-CM | POA: Diagnosis not present

## 2022-12-13 DIAGNOSIS — F4312 Post-traumatic stress disorder, chronic: Secondary | ICD-10-CM | POA: Diagnosis not present

## 2022-12-28 DIAGNOSIS — F4312 Post-traumatic stress disorder, chronic: Secondary | ICD-10-CM | POA: Diagnosis not present

## 2022-12-31 DIAGNOSIS — R079 Chest pain, unspecified: Secondary | ICD-10-CM | POA: Diagnosis not present

## 2022-12-31 DIAGNOSIS — R0789 Other chest pain: Secondary | ICD-10-CM | POA: Diagnosis not present

## 2022-12-31 DIAGNOSIS — R9431 Abnormal electrocardiogram [ECG] [EKG]: Secondary | ICD-10-CM | POA: Diagnosis not present

## 2022-12-31 DIAGNOSIS — R053 Chronic cough: Secondary | ICD-10-CM | POA: Diagnosis not present

## 2023-01-01 DIAGNOSIS — I499 Cardiac arrhythmia, unspecified: Secondary | ICD-10-CM | POA: Diagnosis not present

## 2023-01-03 ENCOUNTER — Other Ambulatory Visit: Payer: Self-pay

## 2023-01-03 DIAGNOSIS — R7303 Prediabetes: Secondary | ICD-10-CM | POA: Insufficient documentation

## 2023-01-03 DIAGNOSIS — F909 Attention-deficit hyperactivity disorder, unspecified type: Secondary | ICD-10-CM | POA: Insufficient documentation

## 2023-01-03 DIAGNOSIS — T7840XA Allergy, unspecified, initial encounter: Secondary | ICD-10-CM | POA: Insufficient documentation

## 2023-01-03 DIAGNOSIS — R000 Tachycardia, unspecified: Secondary | ICD-10-CM | POA: Insufficient documentation

## 2023-01-03 DIAGNOSIS — I319 Disease of pericardium, unspecified: Secondary | ICD-10-CM | POA: Insufficient documentation

## 2023-01-03 DIAGNOSIS — I10 Essential (primary) hypertension: Secondary | ICD-10-CM | POA: Insufficient documentation

## 2023-01-03 DIAGNOSIS — E785 Hyperlipidemia, unspecified: Secondary | ICD-10-CM | POA: Insufficient documentation

## 2023-01-03 DIAGNOSIS — E7800 Pure hypercholesterolemia, unspecified: Secondary | ICD-10-CM | POA: Insufficient documentation

## 2023-01-03 DIAGNOSIS — F419 Anxiety disorder, unspecified: Secondary | ICD-10-CM | POA: Insufficient documentation

## 2023-01-04 ENCOUNTER — Encounter: Payer: Self-pay | Admitting: Emergency Medicine

## 2023-01-04 ENCOUNTER — Ambulatory Visit: Payer: BC Managed Care – PPO

## 2023-01-04 VITALS — BP 131/85 | HR 99 | Ht 73.0 in | Wt 245.0 lb

## 2023-01-04 DIAGNOSIS — R072 Precordial pain: Secondary | ICD-10-CM

## 2023-01-04 DIAGNOSIS — R000 Tachycardia, unspecified: Secondary | ICD-10-CM

## 2023-01-04 DIAGNOSIS — I10 Essential (primary) hypertension: Secondary | ICD-10-CM | POA: Diagnosis not present

## 2023-01-04 HISTORY — DX: Precordial pain: R07.2

## 2023-01-04 MED ORDER — METOPROLOL TARTRATE 25 MG PO TABS: 25.0000 mg | ORAL_TABLET | Freq: Two times a day (BID) | ORAL | 0 refills | Status: AC

## 2023-01-04 NOTE — Assessment & Plan Note (Signed)
Borderline elevated. Continue olmesartan 40 mg once daily Metoprolol tartrate he has been taking 50 mg once a day rather than twice a day as prescribed due to concerns about blood pressures dropping too far down.  Recommended to switch metoprolol tartrate to 25 mg twice a day for better efficacy of around-the-clock coverage.

## 2023-01-04 NOTE — Progress Notes (Signed)
Cardiology Consultation:    Date:  01/04/2023   ID:  Glenn Burton, DOB [birth year], MRN [id number]  PCP:  Patient, No Pcp Per  Cardiologist:  Marlyn Corporal Kennard Fildes, MD   Referring MD: No ref. provider found   Chief Complaint  Patient presents with   transfer of care     ASSESSMENT AND PLAN:   Glenn Burton 37 year old male no significant coronary atherosclerotic burden on CT coronary angiogram from April 2023, prediabetes, ADHD, anxiety, hypertension, hyperlipidemia, former smoker quit in his early 48s, obesity, heartburn and reflux symptoms, excess caffeinated soda consumption, remote history of pericarditis couple episodes while in his 19s.  More recently with atypical chest discomfort going on for about a month and he has been self-medicating with ibuprofen 800 mg 2-3 times a day with some relief, presented to the ER and in the setting of his chronic ECG changes of ST-T segment suggestive of early repolarization versus pericarditis he was started on prednisone taper and also given a prescription for Augmentin for possible community-acquired pneumonia.  He mentions some relief and congestion like symptoms but still has chest discomfort ongoing.  Negative troponins, normal ESR.   Problem List Items Addressed This Visit     Hypertension    Borderline elevated. Continue olmesartan 40 mg once daily Metoprolol tartrate he has been taking 50 mg once a day rather than twice a day as prescribed due to concerns about blood pressures dropping too far down.  Recommended to switch metoprolol tartrate to 25 mg twice a day for better efficacy of around-the-clock coverage.        Relevant Medications   metoprolol tartrate (LOPRESSOR) 25 MG tablet   Other Relevant Orders   ECHOCARDIOGRAM COMPLETE   Sinus tachycardia    Relatively elevated baseline heart rates and sinus tachycardia. Likely underlying issue given his deconditioning, significant caffeine intake, ongoing therapy with  Vyvanse. His symptoms in August appeared to be an exaggerated vagal response in the setting of dehydration.  Advised to keep himself well-hydrated, cut down on caffeine intake to the maximum extent possible.       Precordial pain - Primary    Atypical pain, cannot entirely exclude pericarditis flareup. However more likely symptoms are suggestive of gastritis, gastroesophageal reflux disease and heartburn related.  I would suggest he complete the prednisone taper dose that was started recently. Recommended he cut down on ibuprofen use. Recommended strongly to review with PCP and have further evaluation with gastroenterologist if needed to assess his heartburn and reflux symptoms and possibly consider for an EGD.  His recent cardiac workup with CT coronary angiogram from April 2023 is reassuring. He does have significant cardiovascular risk factors along with obesity, family history, prediabetes, poor dietary habits and ongoing use of Vyvanse  He is extremely concerned about underlying cardiac causes. 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  I will get a baseline transthoracic echocardiogram. Will also obtain an exercise stress echocardiogram to assess for any significant functional limitations, heart rate response, stress related changes on echocardiogram that might suggest ischemia and if these are normal, I can provide him with work clearance as he requests.  If his cardiac workup is unremarkable, GI workup returns unremarkable, I would recommend evaluation with rheumatologist to rule out any underlying autoimmune or connective tissue disorders contributing to his symptoms.  On the differential would be extreme anxiety and concerned about his long-term health given family history.  I  would encourage further discussion with PCP to optimize his anxiety management.      Relevant Orders   MYOCARDIAL PERFUSION  IMAGING   Return to clinic based on test results.  History of Present Illness:    Glenn Burton is a 37 y.o. male who is being seen today for establishing care with cardiology here. Previously followed up with [facility], Dr.Tolia, last visit with them was 05/27/2021.  He has no significant coronary artery disease on CT coronary angiogram April 2023 with calcium score 0, CAD RADS 0 study. Also has history of prediabetes, ADHD, anxiety, hypertension, hyperlipidemia, history of pericarditis [at age 50 and 21], former smoker [quit during his college days], obesity, excess caffeinated soda consumption [up to 24 cans a day, has been cutting down to 2-3 drinks a day], rare social alcohol consumption.  Has significant family history of premature coronary artery disease with his father having MIs in 50s and 85s and later passing away from liver failure and complications.  Previously evaluated for palpitations in the setting of sinus tachycardia with heart monitor7 days study in September 2022 showed average heart rate 95 bpm and rare ventricular and supraventricular ectopy burden.  Patient triggered events correlated with sinus tachycardia. Transthoracic echocardiogram at the time of August 2022 noted normal biventricular function LVEF 60%, mild pulmonary insufficiency.  Here for the visit by himself.  Works as an Teacher, music. Notes in August he had an episode where in the setting of excess heat, dehydration he had symptoms of lightheadedness associated with diaphoresis, was evaluated in the ER and subsequently discharged.  More recently he was in the ER for symptoms of chest discomfort going on for the past month, which has been self medicating with ibuprofen 800 mg up to 2 times a day.  Mentions this gives him some relief. With ongoing symptoms he presented to the ER. blood work in ER at [facility] 12/31/2022 high-sensitivity troponin less than 3, <3. ESR was  unremarkable at 11 No significant abnormalities on comprehensive metabolic panel and CBC other than mildly elevated absolute monocyte count. Last lipid panel from September 24, 2022 total cholesterol 203, triglycerides 195, HDL 43, LDL 126 Hemoglobin A1c 5.9 suggest prediabetes. He was discharged home with a prescription for prednisone taper.  EKG from recent ER visit that he brought along today reviewed noted sinus rhythm with early repolarization changes.  Nonpathological inferior Q waves. Last EKG available to review from September 09, 2022 noted sinus rhythm heart rate 77/min, normal PR interval, normal QRS duration, QTc 427 ms.  He is here for further evaluation of cardiac causes.  Mentions his symptoms are still present but at a low intensity. Describes the sensation as a diffuse pressure to achy sensation.  Present all day long.  He also reports symptoms of heartburn with reflux, worse when he sleeps and had instances where he woke up from sleep due to the symptoms.  He had some relief with over-the-counter Prilosec in the past but discontinued due to concerns it can have in the long run.  He has never had workup with EGD but does report breath test for H. pylori. He also reports mild congestion symptoms and was empirically started on antibiotic therapy with Augmentin which he feels has helped improve his symptoms.  Does not have any significant limitations with activity, no significant exacerbation of his symptoms with activity. No syncopal episode. No blood in urine or stools.  He continues to drink 1 to 2 cups of coffee which helps  him with his migraine symptoms and also keeps him going through the day.  He might consume another can of 2 of soda through the rest of the day.  Previously was consuming up to 24 cans of caffeinated sodas such as Dr. Reino Kent, [employer]  [facility]. Drinks alcohol occasionally. Does not smoke, quit in his college days. No other recreational drug use.  Does have  significant family history for heart disease at a young age both in his mother and father. He is extremely concerned about his own heart health risk in the long run.  Continues to have symptoms for ADHD well-controlled on the current regimen with Vyvanse. Denies any family history of autoimmune or connective tissue disorders  Past Medical History:  Diagnosis Date   ADHD    Allergy    Anxiety    Asthma 06/15/2022   Chronic low back pain without sciatica 06/15/2022   Cough 08/29/2020   COVID-19 01/03/2022   Confirmed by er visit     Fatigue 01/02/2022   Fever 01/02/2022   Hypercholesteremia    Hyperlipidemia    Hypertension    Loss of smell 01/05/2022   Migraine headache 01/19/2011   Pericarditis    Pericarditis    Prediabetes    Sinus congestion 12/08/2021   Sinus tachycardia    Thoracic back pain 04/18/2020    Past Surgical History:  Procedure Laterality Date   TONSILLECTOMY  age 29   tubes in ears   tubes      Current Medications: Current Meds  Medication Sig   albuterol (VENTOLIN HFA) 108 (90 Base) MCG/ACT inhaler Inhale 1-2 puffs into the lungs every 6 (six) hours as needed for wheezing or shortness of breath.   allopurinol (ZYLOPRIM) 100 MG tablet Take 100 mg by mouth daily.   cholecalciferol (VITAMIN D3) 25 MCG (1000 UNIT) tablet Take 1,000 Units by mouth daily.   cyclobenzaprine (FLEXERIL) 10 MG tablet Take 5 mg by mouth 3 (three) times daily as needed for muscle spasms.   escitalopram (LEXAPRO) 5 MG tablet Take 5 mg by mouth daily as needed (for anxiety attacks).   escitalopram (LEXAPRO) 5 MG tablet Take 5 mg by mouth at bedtime.   lisdexamfetamine (VYVANSE) 50 MG capsule Take 1 capsule (50 mg total) by mouth daily.   olmesartan (BENICAR) 40 MG tablet Take 40 mg by mouth daily.   [DISCONTINUED] metoprolol tartrate (LOPRESSOR) 50 MG tablet Take 50 mg by mouth daily.     Allergies:   Other, Clindamycin, Clindamycin/lincomycin, and Prednisone   Social History    Socioeconomic History   Marital status: Married    Spouse name: Not on file   Number of children: Not on file   Years of education: Not on file   Highest education level: Not on file  Occupational History   Not on file  Tobacco Use   Smoking status: Former    Current packs/day: 0.00    Average packs/day: 0.3 packs/day for 1 year (0.3 ttl pk-yrs)    Types: Cigarettes    Start date: 04/26/2006    Quit date: 04/26/2007    Years since quitting: 15.7   Smokeless tobacco: Never  Vaping Use   Vaping status: Never Used  Substance and Sexual Activity   Alcohol use: Yes    Comment: occasional   Drug use: Never   Sexual activity: Not on file  Other Topics Concern   Not on file  Social History Narrative   ** Merged History Encounter **  Social Determinants of Health   Financial Resource Strain: Not on file  Food Insecurity: Not on file  Transportation Needs: Not on file  Physical Activity: Not on file  Stress: Not on file  Social Connections: Unknown (06/21/2021)   [facility]   Social Network    Social Network: Not on file     Family History: The patient's family history includes Cancer in his father and paternal grandfather; Coronary artery disease in his father; Diabetes in his father; Heart disease in his father and paternal grandmother; Hyperlipidemia in his father and mother; Hypertension in his father and mother; Migraines in his mother. ROS:   Please see the history of present illness.    All 14 point review of systems negative except as described per history of present illness.  EKGs/Labs/Other Studies Reviewed:    The following studies were reviewed today:   EKG:       Recent Labs: 09/09/2022: ALT 42; BUN 12; Creatinine, Ser 0.93; Hemoglobin 14.6; Platelets 214; Potassium 3.9; Sodium 137  Recent Lipid Panel No results found for: "CHOL", "TRIG", "HDL", "CHOLHDL", "VLDL", "LDLCALC", "LDLDIRECT"  Physical Exam:    VS:  BP  131/85 (BP Location: Right Arm, Patient Position: Sitting, Cuff Size: Normal)   Pulse 99   Ht 6\' 1"  (1.854 m)   Wt 245 lb (111.1 kg)   SpO2 96%   BMI 32.32 kg/m     Wt Readings from Last 3 Encounters:  01/04/23 245 lb (111.1 kg)  09/09/22 254 lb (115.2 kg)  06/29/22 247 lb (112 kg)     GENERAL:  Well nourished, well developed in no acute distress NECK: No JVD; No carotid bruits CARDIAC: RRR, S1 and S2 present, no murmurs, no rubs, no gallops CHEST:  Clear to auscultation without rales, wheezing or rhonchi  Extremities: No pitting pedal edema. Pulses bilaterally symmetric with radial 2+ and dorsalis pedis 2+ NEUROLOGIC:  Alert and oriented x 3  Medication Adjustments/Labs and Tests Ordered: Current medicines are reviewed at length with the patient today.  Concerns regarding medicines are outlined above.  Orders Placed This Encounter  Procedures   MYOCARDIAL PERFUSION IMAGING   ECHOCARDIOGRAM COMPLETE   Meds ordered this encounter  Medications   metoprolol tartrate (LOPRESSOR) 25 MG tablet    Sig: Take 1 tablet (25 mg total) by mouth in the morning and at bedtime.    Dispense:  180 tablet    Refill:  0    Signed, Wyatt Galvan reddy Strother Everitt, MD, MPH, [facility]. 01/04/2023 2:08 PM    [facility]

## 2023-01-04 NOTE — Addendum Note (Signed)
Addended by: Lonia Farber on: 01/04/2023 04:56 PM   Modules accepted: Orders

## 2023-01-04 NOTE — Addendum Note (Signed)
Addended by: Huntley Dec REDDY on: 01/04/2023 05:10 PM   Modules accepted: Orders

## 2023-01-04 NOTE — Assessment & Plan Note (Addendum)
Atypical pain, cannot entirely exclude pericarditis flareup. However more likely symptoms are suggestive of gastritis, gastroesophageal reflux disease and heartburn related.  I would suggest he complete the prednisone taper dose that was started recently. Recommended he cut down on ibuprofen use. Recommended strongly to review with PCP and have further evaluation with gastroenterologist if needed to assess his heartburn and reflux symptoms and possibly consider for an EGD.  His recent cardiac workup with CT coronary angiogram from April 2023 is reassuring. He does have significant cardiovascular risk factors along with obesity, family history, prediabetes, poor dietary habits and ongoing use of Vyvanse  He is extremely concerned about underlying cardiac causes. 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  I will get a baseline transthoracic echocardiogram. Will also obtain an exercise stress echocardiogram to assess for any significant functional limitations, heart rate response, stress related changes on echocardiogram that might suggest ischemia and if these are normal, I can provide him with work clearance as he requests.  If his cardiac workup is unremarkable, GI workup returns unremarkable, I would recommend evaluation with rheumatologist to rule out any underlying autoimmune or connective tissue disorders contributing to his symptoms.  On the differential would be extreme anxiety and concerned about his long-term health given family history.  I would encourage further discussion with PCP to optimize his anxiety management.

## 2023-01-04 NOTE — Patient Instructions (Signed)
Medication Instructions:   Your physician has recommended you make the following change in your medication:  Stop taking Metoprolol 50 mg. Start taking Metoprolol 25 mg twice a day.   *If you need a refill on your cardiac medications before your next appointment, please call your pharmacy*   Lab Work: None ordered If you have labs (blood work) drawn today and your tests are completely normal, you will receive your results only by: MyChart Message (if you have MyChart) OR A paper copy in the mail If you have any lab test that is abnormal or we need to change your treatment, we will call you to review the results.   Testing/Procedures: Echocardiogram An echocardiogram is a test that uses sound waves (ultrasound) to produce images of the heart. Images from an echocardiogram can provide important information about: Heart size and shape. The size and thickness and movement of your heart's walls. Heart muscle function and strength. Heart valve function or if you have stenosis. Stenosis is when the heart valves are too narrow. If blood is flowing backward through the heart valves (regurgitation). A tumor or infectious growth around the heart valves. Areas of heart muscle that are not working well because of poor blood flow or injury from a heart attack. Aneurysm detection. An aneurysm is a weak or damaged part of an artery wall. The wall bulges out from the normal force of blood pumping through the body. Tell a health care provider about: Any allergies you have. All medicines you are taking, including vitamins, herbs, eye drops, creams, and over-the-counter medicines. Any blood disorders you have. Any surgeries you have had. Any medical conditions you have. Whether you are pregnant or may be pregnant. What are the risks? Generally, this is a safe test. However, problems may occur, including an allergic reaction to dye (contrast) that may be used during the test. What happens before the  test? No specific preparation is needed. You may eat and drink normally. What happens during the test?  You will take off your clothes from the waist up and put on a hospital gown. Electrodes or electrocardiogram (ECG)patches may be placed on your chest. The electrodes or patches are then connected to a device that monitors your heart rate and rhythm. You will lie down on a table for an ultrasound exam. A gel will be applied to your chest to help sound waves pass through your skin. A handheld device, called a transducer, will be pressed against your chest and moved over your heart. The transducer produces sound waves that travel to your heart and bounce back (or "echo" back) to the transducer. These sound waves will be captured in real-time and changed into images of your heart that can be viewed on a video monitor. The images will be recorded on a computer and reviewed by your health care provider. You may be asked to change positions or hold your breath for a short time. This makes it easier to get different views or better views of your heart. In some cases, you may receive contrast through an IV in one of your veins. This can improve the quality of the pictures from your heart. The procedure may vary among health care providers and hospitals. What can I expect after the test? You may return to your normal, everyday life, including diet, activities, and medicines, unless your health care provider tells you not to do that. Follow these instructions at home: It is up to you to get the results of your test.  Ask your health care provider, or the department that is doing the test, when your results will be ready. Keep all follow-up visits. This is important. Summary An echocardiogram is a test that uses sound waves (ultrasound) to produce images of the heart. Images from an echocardiogram can provide important information about the size and shape of your heart, heart muscle function, heart valve  function, and other possible heart problems. You do not need to do anything to prepare before this test. You may eat and drink normally. After the echocardiogram is completed, you may return to your normal, everyday life, unless your health care provider tells you not to do that. This information is not intended to replace advice given to you by your health care provider. Make sure you discuss any questions you have with your health care provider. Document Revised: 10/08/2020 Document Reviewed: 09/18/2019 Elsevier Patient Education  2023 [employer]      You are scheduled for a Myocardial Perfusion Imaging Study.  Please arrive 15 minutes prior to your appointment time for registration and insurance purposes.  The test will take approximately 3 to 4 hours to complete; you may bring reading material.  If someone comes with you to your appointment, they will need to remain in the main lobby due to limited space in the testing area.   How to prepare for your Myocardial Perfusion Test: Do not eat or drink 3 hours prior to your test, except you may have water. Do not consume products containing caffeine (regular or decaffeinated) 12 hours prior to your test. (ex: coffee, chocolate, sodas, tea). Do bring a list of your current medications with you.  If not listed below, you may take your medications as normal. Do not take metoprolol (Lopressor, Toprol) for 24 hours prior to the test.  Bring the medication to your appointment as you may be required to take it once the test is complete. Do wear comfortable clothes (no dresses or overalls) and walking shoes, tennis shoes preferred (No heels or open toe shoes are allowed). Do NOT wear cologne, perfume, aftershave, or lotions (deodorant is allowed). If these instructions are not followed, your test will have to be rescheduled.  If you cannot keep your appointment, please provide 24 hours notification to the Nuclear Lab, to avoid a possible $50 charge  to your account.  Follow-Up: At [facility], you and your health needs are our priority.  As part of our continuing mission to provide you with exceptional heart care, we have created designated Provider Care Teams.  These Care Teams include your primary Cardiologist (physician) and Advanced Practice Providers (APPs -  Physician Assistants and Nurse Practitioners) who all work together to provide you with the care you need, when you need it.  We recommend signing up for the patient portal called "MyChart".  Sign up information is provided on this After Visit Summary.  MyChart is used to connect with patients for Virtual Visits (Telemedicine).  Patients are able to view lab/test results, encounter notes, upcoming appointments, etc.  Non-urgent messages can be sent to your provider as well.   To learn more about what you can do with MyChart, go to [email or web address].    Your next appointment:   Based on test results   Other Instructions  Cardiac Nuclear Scan A cardiac nuclear scan is a test that is done to check the flow of blood to your heart. It is done when you are resting and when you are exercising. The test looks for  problems such as: Not enough blood reaching a portion of the heart. The heart muscle not working as it should. You may need this test if you have: Heart disease. Lab results that are not normal. Had heart surgery or a balloon procedure to open up blocked arteries (angioplasty) or a small mesh tube (stent). Chest pain. Shortness of breath. Had a heart attack. In this test, a special dye (tracer) is put into your bloodstream. The tracer will travel to your heart. A camera will then take pictures of your heart to see how the tracer moves through your heart. This test is usually done at a hospital and takes 2-4 hours. Tell a doctor about: Any allergies you have. All medicines you are taking, including vitamins, herbs, eye drops, creams, and over-the-counter  medicines. Any bleeding problems you have. Any surgeries you have had. Any medical conditions you have. Whether you are pregnant or may be pregnant. Any history of asthma or long-term (chronic) lung disease. Any history of heart rhythm disorders or heart valve conditions. What are the risks? Your doctor will talk with you about risks. These may include: Serious chest pain and heart attack. This is only a risk if the stress portion of the test is done. Fast or uneven heartbeats (palpitations). A feeling of warmth in your chest. This feeling usually does not last long. Allergic reaction to the tracer. Shortness of breath or trouble breathing. What happens before the test? Ask your doctor about changing or stopping your normal medicines. Follow instructions from your doctor about what you cannot eat or drink. Remove your jewelry on the day of the test. Ask your doctor if you need to avoid nicotine or caffeine. What happens during the test? An IV tube will be inserted into one of your veins. Your doctor will give you a small amount of tracer through the IV tube. You will wait for 20-40 minutes while the tracer moves through your bloodstream. Your heart will be monitored with an electrocardiogram (ECG). You will lie down on an exam table. Pictures of your heart will be taken for about 15-20 minutes. You may also have a stress test. For this test, one of these things may be done: You will be asked to exercise on a treadmill or a stationary bike. You will be given medicines that will make your heart work harder. This is done if you are unable to exercise. When blood flow to your heart has peaked, a tracer will again be given through the IV tube. After 20-40 minutes, you will get back on the exam table. More pictures will be taken of your heart. Depending on the tracer that is used, more pictures may need to be taken 3-4 hours later. Your IV tube will be removed when the test is over. The  test may vary among doctors and hospitals. What happens after the test? Ask your doctor: Whether you can return to your normal schedule, including diet, activities, travel, and medicines. Whether you should drink more fluids. This will help to remove the tracer from your body. Ask your doctor, or the department that is doing the test: When will my results be ready? How will I get my results? What are my treatment options? What other tests do I need? What are my next steps? This information is not intended to replace advice given to you by your health care provider. Make sure you discuss any questions you have with your health care provider. Document Revised: 06/23/2021 Document Reviewed: 06/23/2021 Elsevier Patient  Education  2023 [employer].

## 2023-01-04 NOTE — Assessment & Plan Note (Signed)
Relatively elevated baseline heart rates and sinus tachycardia. Likely underlying issue given his deconditioning, significant caffeine intake, ongoing therapy with Vyvanse. His symptoms in August appeared to be an exaggerated vagal response in the setting of dehydration.  Advised to keep himself well-hydrated, cut down on caffeine intake to the maximum extent possible.

## 2023-01-05 DIAGNOSIS — F902 Attention-deficit hyperactivity disorder, combined type: Secondary | ICD-10-CM | POA: Diagnosis not present

## 2023-01-05 DIAGNOSIS — R0982 Postnasal drip: Secondary | ICD-10-CM | POA: Diagnosis not present

## 2023-01-05 DIAGNOSIS — Z79899 Other long term (current) drug therapy: Secondary | ICD-10-CM | POA: Diagnosis not present

## 2023-01-05 DIAGNOSIS — H6122 Impacted cerumen, left ear: Secondary | ICD-10-CM | POA: Diagnosis not present

## 2023-01-10 IMAGING — CT CT HEART MORP W/ CTA COR W/ SCORE W/ CA W/CM &/OR W/O CM
4 of 7 series · 8 of 20 positions shown, 9 images · IV contrast (omnipaque)
Comparison: None.
COMPARISON: None.

Addendum:
EXAM:
OVER-READ INTERPRETATION  CT CHEST

The following report is an over-read performed by radiologist Dr.
Bambucafe Tarla [REDACTED] on 05/18/2021. This
over-read does not include interpretation of cardiac or coronary
anatomy or pathology. The coronary calcium score/coronary CTA
interpretation by the cardiologist is attached.
HISTORY: Chest pain/anginal equiv, ECGs and troponins normal
Cardiac/Coronary  CT
TECHNIQUE: The patient was scanned on a Siemens Force scanner.
PROTOCOL: A 120 kV prospective scan was triggered in the descending thoracic
aorta at 111 HU's. Axial non-contrast 3 mm slices were carried out
through the heart. The data set was analyzed on a dedicated work
station and scored using the Agatson method. Gantry rotation speed
was 250 msecs and collimation was .6 mm. No IV beta blockade but
mg of sl NTG was given. The 3D data set was reconstructed in 10%
intervals of the 0-90 % of the R-R cycle. Diastolic phases were
analyzed on a dedicated work station using MPR, MIP and VRT modes.
The patient received 100mL OMNIPAQUE IOHEXOL 350 MG/ML SOLN of
contrast.

[Series 6: best diast · axial · 0.39mm/px · z∈[-83,-41]mm · 2 of 317 slices shown, 3 images]
[im 106/317  vessel]
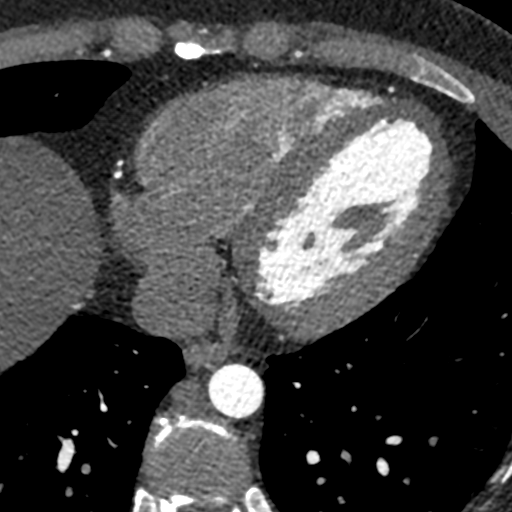
[im 106/317  lung]
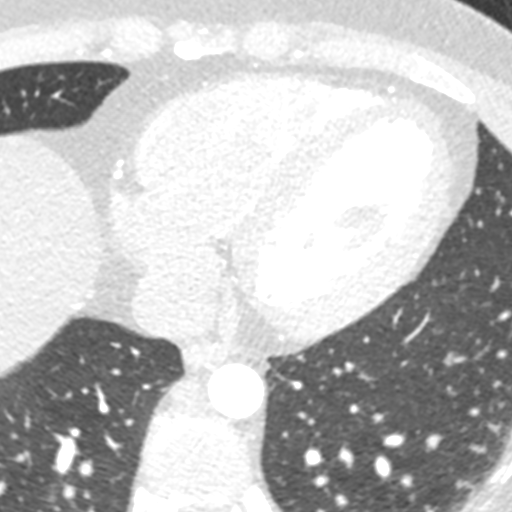
[im 211/317  vessel]
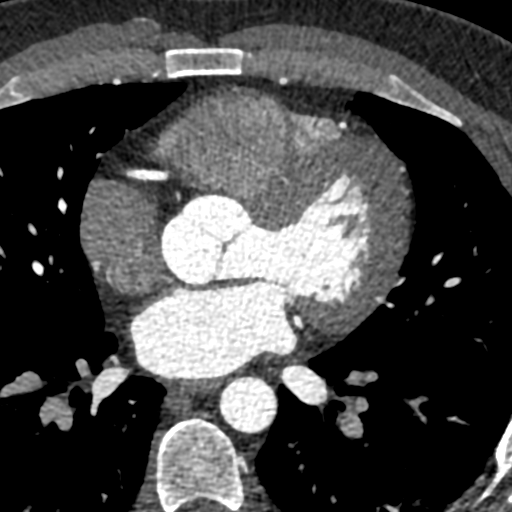

[Series 7: best syst · axial · 0.39mm/px · z∈[-83,-41]mm · 2 of 317 slices shown]
[im 106/317  vessel]
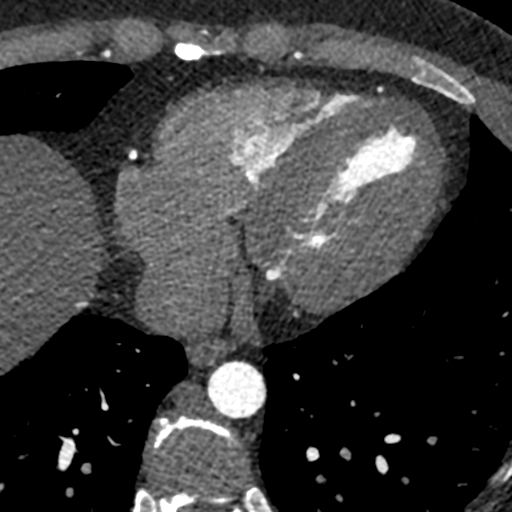
[im 211/317  vessel]
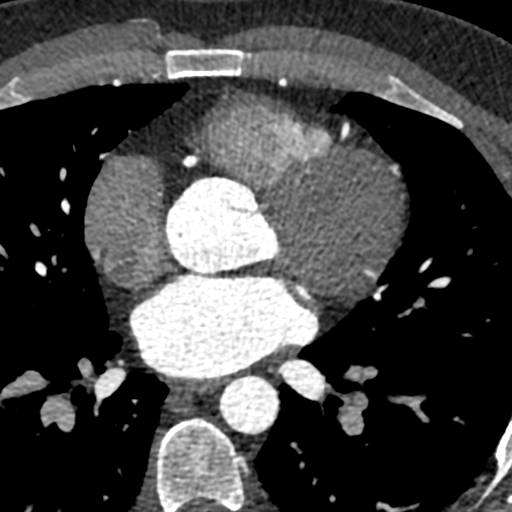

[Series 8: ts diast · axial · 0.39mm/px · z∈[-83,-41]mm · 2 of 317 slices shown]
[im 106/317  vessel]
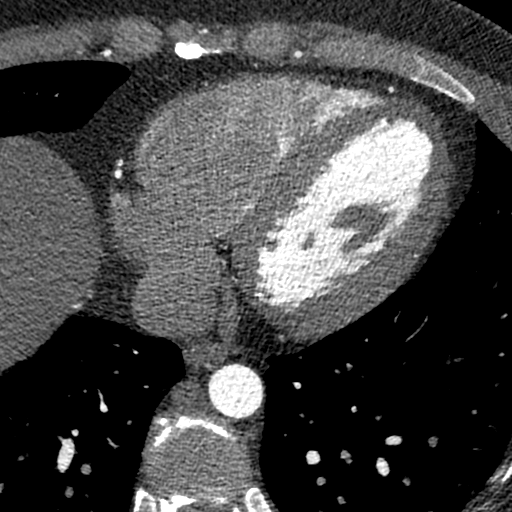
[im 211/317  vessel]
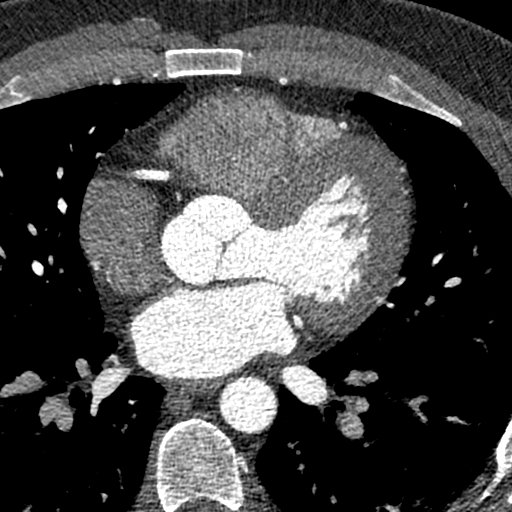

[Series 9: ts syst · axial · 0.39mm/px · z∈[-83,-41]mm · 2 of 317 slices shown]
[im 106/317  vessel]
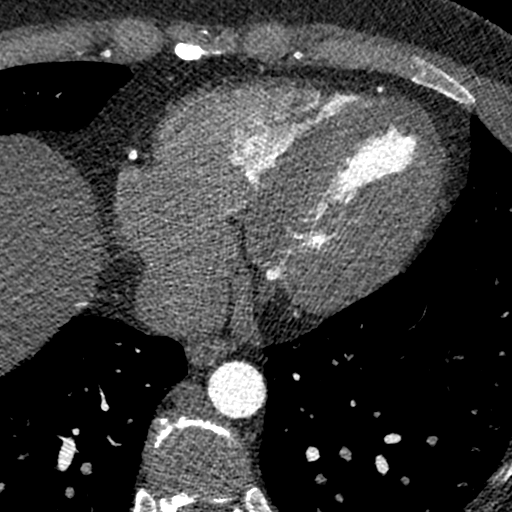
[im 211/317  vessel]
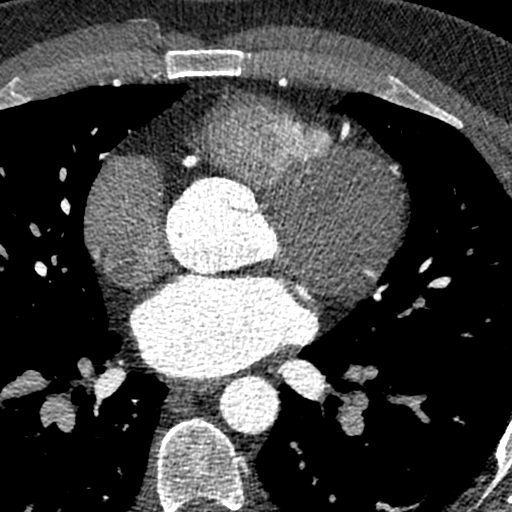

[8 of 20 positions shown; findings below may reference images not displayed]

FINDINGS: Within the visualized portions of the thorax there are no suspicious
appearing pulmonary nodules or masses, there is no acute
consolidative airspace disease, no pleural effusions, no
pneumothorax and no lymphadenopathy. Visualized portions of the
upper abdomen are unremarkable. There are no aggressive appearing
lytic or blastic lesions noted in the visualized portions of the
skeleton.
IMPRESSION: 1. No significant incidental noncardiac findings are noted.
FINDINGS: Image quality: excellent.

Artifact: Limited.

Coronary artery calcification score: Total coronary calcium score of
0.

Coronary arteries: Normal coronary origins.  Right dominance.

Left Main Coronary Artery: The left main is a normal caliber vessel
with a normal take off from the left coronary cusp that bifurcates
to form a left anterior descending artery and a left circumflex
artery. There is no plaque or stenosis.

Left Anterior Descending Coronary Artery: Normal caliber vessel,
wraps the apex, gives off 1 patent diagonal branches. The LAD is
patent without evidence of plaque or stenosis.

Left Circumflex Artery: Normal caliber vessel, non-dominant, travels
within the atrioventricular groove, gives off 3 patent obtuse
marginal branches. The LCX is patent with no evidence of plaque or
stenosis.

Right Coronary Artery: The RCA is dominant with normal take off from
the right coronary cusp. The RCA terminates as a PDA and right
posterolateral branch without evidence of plaque or stenosis.

Left Atrium: Grossly normal in size with no left atrial appendage
filling defect.

Left Ventricle: Grossly normal in size. There are no stigmata of
prior infarction. There is no abnormal filling defect.

Pulmonary arteries: Normal in size without proximal filling defect.

Pulmonary veins: Normal pulmonary venous drainage.

Aorta: Normal size, 34 mm at the mid ascending aorta (level of the
PA bifurcation) measured double oblique. No calcifications. No
dissection.

Pericardium: Normal thickness with no significant effusion or
calcium present.

Cardiac valves: The aortic valve is trileaflet without significant
calcification. The mitral valve is normal structure without
significant calcification.

Extra-cardiac findings: See attached radiology report for
non-cardiac structures.
IMPRESSION: 1. Total coronary calcium score of 0.

2. Normal coronary origin with right dominance.

3. CAD-RADS = 0 No evidence of CAD (0%).

Left main: Patent.

LAD: Patent.

LCx: Patent.

RCA: Patent.

RECOMMENDATIONS:

Consider non-atherosclerotic causes of chest pain.

*** End of Addendum ***
EXAM:
OVER-READ INTERPRETATION  CT CHEST

The following report is an over-read performed by radiologist Dr.
Bambucafe Tarla [REDACTED] on 05/18/2021. This
over-read does not include interpretation of cardiac or coronary
anatomy or pathology. The coronary calcium score/coronary CTA
interpretation by the cardiologist is attached.
FINDINGS: Within the visualized portions of the thorax there are no suspicious
appearing pulmonary nodules or masses, there is no acute
consolidative airspace disease, no pleural effusions, no
pneumothorax and no lymphadenopathy. Visualized portions of the
upper abdomen are unremarkable. There are no aggressive appearing
lytic or blastic lesions noted in the visualized portions of the
skeleton.
IMPRESSION: 1. No significant incidental noncardiac findings are noted.

## 2023-01-11 DIAGNOSIS — I10 Essential (primary) hypertension: Secondary | ICD-10-CM | POA: Diagnosis not present

## 2023-01-11 DIAGNOSIS — R072 Precordial pain: Secondary | ICD-10-CM | POA: Diagnosis not present

## 2023-01-11 DIAGNOSIS — R071 Chest pain on breathing: Secondary | ICD-10-CM | POA: Diagnosis not present

## 2023-01-12 ENCOUNTER — Ambulatory Visit (HOSPITAL_BASED_OUTPATIENT_CLINIC_OR_DEPARTMENT_OTHER): Payer: BC Managed Care – PPO

## 2023-01-12 DIAGNOSIS — R072 Precordial pain: Secondary | ICD-10-CM | POA: Diagnosis not present

## 2023-01-12 DIAGNOSIS — R071 Chest pain on breathing: Secondary | ICD-10-CM | POA: Diagnosis not present

## 2023-01-12 DIAGNOSIS — I10 Essential (primary) hypertension: Secondary | ICD-10-CM | POA: Diagnosis not present

## 2023-01-13 ENCOUNTER — Ambulatory Visit: Payer: BC Managed Care – PPO

## 2023-01-13 VITALS — BP 126/72 | HR 68 | Ht 73.0 in | Wt 253.4 lb

## 2023-01-13 DIAGNOSIS — I10 Essential (primary) hypertension: Secondary | ICD-10-CM | POA: Diagnosis not present

## 2023-01-13 DIAGNOSIS — R000 Tachycardia, unspecified: Secondary | ICD-10-CM | POA: Diagnosis not present

## 2023-01-13 NOTE — Patient Instructions (Signed)
Medication Instructions:  Your physician recommends that you continue on your current medications as directed. Please refer to the Current Medication list given to you today.  *If you need a refill on your cardiac medications before your next appointment, please call your pharmacy*   Lab Work: None If you have labs (blood work) drawn today and your tests are completely normal, you will receive your results only by: MyChart Message (if you have MyChart) OR A paper copy in the mail If you have any lab test that is abnormal or we need to change your treatment, we will call you to review the results.   Testing/Procedures: None   Follow-Up: At [facility], you and your health needs are our priority.  As part of our continuing mission to provide you with exceptional heart care, we have created designated Provider Care Teams.  These Care Teams include your primary Cardiologist (physician) and Advanced Practice Providers (APPs -  Physician Assistants and Nurse Practitioners) who all work together to provide you with the care you need, when you need it.  We recommend signing up for the patient portal called "MyChart".  Sign up information is provided on this After Visit Summary.  MyChart is used to connect with patients for Virtual Visits (Telemedicine).  Patients are able to view lab/test results, encounter notes, upcoming appointments, etc.  Non-urgent messages can be sent to your provider as well.   To learn more about what you can do with MyChart, go to [email or web address].    Your next appointment:   Follow up as needed  Provider:   Huntley Dec, MD    Other Instructions None

## 2023-01-13 NOTE — Progress Notes (Signed)
Cardiology Consultation:    Date:  01/13/2023   ID:  Glenn Burton, DOB [birth year], MRN [id number]  PCP:  Patient, No Pcp Per  Cardiologist:  Marlyn Corporal Taneshia Lorence, MD   Referring MD: No ref. provider found   No chief complaint on file.    ASSESSMENT AND PLAN:   Glenn Burton is here for follow-up visit after his recent echocardiogram and stress test for workup completed at [facility].  Has history of prediabetes, ADHD, anxiety, hypertension, hyperlipidemia, former smoker quit in his 86s, obesity, heartburn and reflux symptoms, excess caffeinated soda consumption, calcium score 0 and CAD RADS 0 study on CT coronary angiogram April 2023.  Problem List Items Addressed This Visit     Hypertension - Primary    Improved control on olmesartan once once daily and metoprolol titrate 25 mg twice daily. Good control at this time.  Continue with the same medications. .       Sinus tachycardia    Mostly related to deconditioning and medication such as Vyvanse he is currently on. Now that they are workup for cardiac function with stress echo returned normal, advised him to continue exercising on a regular basis.    Moderate intensity exercises with the heart rates up to 70% of MPHR 2-3 times a week for weight loss recommended.  This should also over time help with cardiac conditioning.      He is okay to return to work without any further restrictions at this time.  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  Paperwork for FMLA filled out today in his presence and handed back to him.    History of Present Illness:    Glenn Burton is a 37 y.o. male who is being seen today for follow-up visit.  He was recently seen in the office on 01/04/2023. Pleasant gentleman here for the visit accompanied by his 2 young sons.  2 year old with history of prediabetes, ADHD, anxiety, hypertension, hyperlipidemia, former smoker.  Quit in his early 76s, obesity, GERD, excess  caffeinated soda consumption, episodes of pericarditis in his 56s.  CT coronary angiogram in April 2023 calcium score 0 and CAD RADS 0 study  More recently with atypical chest discomfort and was self-medicating with ibuprofen 800 mg 2-3 times a day with some relief was seen in the ER in the setting of chronic ECG changes which to me appears more consistent with early repolarization changes.  He was empirically started on prednisone taper and Augmentin for community-acquired pneumonia symptoms.  After evaluating him in the office we schedule him for a transthoracic echocardiogram and treadmill stress echocardiogram to assess for any structural functional abnormalities and to assess his exercise tolerance.  He had these tests done at [facility] over the last 2 days on 01/11/2023 and 01/12/2023. Both these results are reassuring. With transthoracic echocardiogram showing LVEF 55 to 60%, normal biventricular function with no significant valve abnormalities. On treadmill stress echo he went on the treadmill for 9-1/2 minutes, with normal blood pressure response, normal heart rate response, attaining maximal heart rate of 193 bpm which is 103% of MPHR, normal augmentation of all segments and no suggestion of ischemia based on stress echo.   Doing well at this time.  No active symptoms. Feels his congestion symptoms have improved. He has cut down on using ibuprofen for any discomfort. Also cut down on caffeinated soda consumption  Plans to return to work. Requesting return to work letter and [employer] paperwork for the days he missed.  Past Medical History:  Diagnosis Date   ADHD    Allergy    Anxiety    Asthma 06/15/2022   Chronic low back pain without sciatica 06/15/2022   Cough 08/29/2020   COVID-19 01/03/2022   Confirmed by er visit     Fatigue 01/02/2022   Fever 01/02/2022   Hypercholesteremia    Hyperlipidemia    Hypertension    Loss of smell 01/05/2022   Migraine  headache 01/19/2011   Pericarditis    Pericarditis    Precordial pain 01/04/2023   Prediabetes    Sinus congestion 12/08/2021   Sinus tachycardia    Thoracic back pain 04/18/2020    Past Surgical History:  Procedure Laterality Date   TONSILLECTOMY  age 18   tubes in ears   tubes      Current Medications: Current Meds  Medication Sig   albuterol (VENTOLIN HFA) 108 (90 Base) MCG/ACT inhaler Inhale 1-2 puffs into the lungs every 6 (six) hours as needed for wheezing or shortness of breath.   allopurinol (ZYLOPRIM) 100 MG tablet Take 100 mg by mouth daily.   cholecalciferol (VITAMIN D3) 25 MCG (1000 UNIT) tablet Take 1,000 Units by mouth daily.   cyclobenzaprine (FLEXERIL) 10 MG tablet Take 5 mg by mouth 3 (three) times daily as needed for muscle spasms.   escitalopram (LEXAPRO) 5 MG tablet Take 5 mg by mouth daily as needed (for anxiety attacks).   escitalopram (LEXAPRO) 5 MG tablet Take 5 mg by mouth at bedtime.   lisdexamfetamine (VYVANSE) 50 MG capsule Take 1 capsule (50 mg total) by mouth daily.   metoprolol tartrate (LOPRESSOR) 25 MG tablet Take 1 tablet (25 mg total) by mouth in the morning and at bedtime.   olmesartan (BENICAR) 40 MG tablet Take 40 mg by mouth daily.   rosuvastatin (CRESTOR) 10 MG tablet Take 10 mg by mouth at bedtime.     Allergies:   Other, Clindamycin, Clindamycin/lincomycin, and Prednisone   Social History   Socioeconomic History   Marital status: Married    Spouse name: Not on file   Number of children: Not on file   Years of education: Not on file   Highest education level: Not on file  Occupational History   Not on file  Tobacco Use   Smoking status: Former    Current packs/day: 0.00    Average packs/day: 0.3 packs/day for 1 year (0.3 ttl pk-yrs)    Types: Cigarettes    Start date: 04/26/2006    Quit date: 04/26/2007    Years since quitting: 15.7   Smokeless tobacco: Never  Vaping Use   Vaping status: Never Used  Substance and Sexual  Activity   Alcohol use: Yes    Comment: occasional   Drug use: Never   Sexual activity: Not on file  Other Topics Concern   Not on file  Social History Narrative   ** Merged History Encounter **       Social Determinants of Health   Financial Resource Strain: Not on file  Food Insecurity: Not on file  Transportation Needs: Not on file  Physical Activity: Not on file  Stress: Not on file  Social Connections: Unknown (06/21/2021)   Received from [facility], [facility]   Social Network    Social Network: Not on file     Family History: The patient's family history includes Cancer in his father and paternal grandfather; Coronary artery disease in his father; Diabetes in his father; Heart disease in his father  and paternal grandmother; Hyperlipidemia in his father and mother; Hypertension in his father and mother; Migraines in his mother. ROS:   Please see the history of present illness.    All 14 point review of systems negative except as described per history of present illness.  EKGs/Labs/Other Studies Reviewed:    The following studies were reviewed today:   EKG:       Recent Labs: 09/09/2022: ALT 42; BUN 12; Creatinine, Ser 0.93; Hemoglobin 14.6; Platelets 214; Potassium 3.9; Sodium 137  Recent Lipid Panel No results found for: "CHOL", "TRIG", "HDL", "CHOLHDL", "VLDL", "LDLCALC", "LDLDIRECT"  Physical Exam:    VS:  BP 126/72   Pulse 68   Ht 6\' 1"  (1.854 m)   Wt 253 lb 6.4 oz (114.9 kg)   SpO2 96%   BMI 33.43 kg/m     Wt Readings from Last 3 Encounters:  01/13/23 253 lb 6.4 oz (114.9 kg)  01/04/23 245 lb (111.1 kg)  09/09/22 254 lb (115.2 kg)     GENERAL:  Well nourished, well developed in no acute distress NEUROLOGIC:  Alert and oriented x 3  Medication Adjustments/Labs and Tests Ordered: Current medicines are reviewed at length with the patient today.  Concerns regarding medicines are outlined above.  No orders of the defined types were placed in  this encounter.  No orders of the defined types were placed in this encounter.   Signed, Samiel Peel reddy Ameliarose Shark, MD, MPH, [facility]. 01/13/2023 4:25 PM    [facility]

## 2023-01-13 NOTE — Assessment & Plan Note (Signed)
Mostly related to deconditioning and medication such as Vyvanse he is currently on. Now that they are workup for cardiac function with stress echo returned normal, advised him to continue exercising on a regular basis.    Moderate intensity exercises with the heart rates up to 70% of MPHR 2-3 times a week for weight loss recommended.  This should also over time help with cardiac conditioning.

## 2023-01-13 NOTE — Assessment & Plan Note (Signed)
Improved control on olmesartan once once daily and metoprolol titrate 25 mg twice daily. Good control at this time.  Continue with the same medications. Marland Kitchen

## 2023-01-24 DIAGNOSIS — F4312 Post-traumatic stress disorder, chronic: Secondary | ICD-10-CM | POA: Diagnosis not present
